# Patient Record
Sex: Male | Born: 1950 | Race: Black or African American | Hispanic: No | Marital: Married | Smoking: Former smoker
Health system: Southern US, Community
[De-identification: ages and names within clinical notes are randomized; demographics above are authoritative.]

## PROBLEM LIST (undated history)

## (undated) DIAGNOSIS — R32 Unspecified urinary incontinence: Secondary | ICD-10-CM

## (undated) DIAGNOSIS — M8580 Other specified disorders of bone density and structure, unspecified site: Secondary | ICD-10-CM

## (undated) DIAGNOSIS — E291 Testicular hypofunction: Secondary | ICD-10-CM

## (undated) DIAGNOSIS — E785 Hyperlipidemia, unspecified: Secondary | ICD-10-CM

## (undated) DIAGNOSIS — G35 Multiple sclerosis: Secondary | ICD-10-CM

## (undated) DIAGNOSIS — N529 Male erectile dysfunction, unspecified: Secondary | ICD-10-CM

## (undated) HISTORY — DX: Multiple sclerosis: G35

## (undated) HISTORY — DX: Testicular hypofunction: E29.1

## (undated) HISTORY — DX: Unspecified urinary incontinence: R32

## (undated) HISTORY — DX: Other specified disorders of bone density and structure, unspecified site: M85.80

## (undated) HISTORY — DX: Hyperlipidemia, unspecified: E78.5

## (undated) HISTORY — DX: Male erectile dysfunction, unspecified: N52.9

## (undated) HISTORY — PX: UMBILICAL HERNIA REPAIR: SHX196

---

## 2002-03-16 ENCOUNTER — Encounter: Admission: RE | Admit: 2002-03-16 | Discharge: 2002-06-14 | Payer: Self-pay

## 2002-04-07 ENCOUNTER — Encounter: Admission: RE | Admit: 2002-04-07 | Discharge: 2002-05-21 | Payer: Self-pay

## 2002-07-30 ENCOUNTER — Encounter: Admission: RE | Admit: 2002-07-30 | Discharge: 2002-09-22 | Payer: Self-pay

## 2002-09-25 ENCOUNTER — Encounter: Admission: RE | Admit: 2002-09-25 | Discharge: 2002-12-24 | Payer: Self-pay

## 2003-02-22 ENCOUNTER — Encounter: Admission: RE | Admit: 2003-02-22 | Discharge: 2003-02-22 | Payer: Self-pay | Admitting: Family Medicine

## 2003-02-22 ENCOUNTER — Encounter: Payer: Self-pay | Admitting: Family Medicine

## 2003-02-23 ENCOUNTER — Encounter: Admission: RE | Admit: 2003-02-23 | Discharge: 2003-02-23 | Payer: Self-pay | Admitting: Family Medicine

## 2003-02-23 ENCOUNTER — Encounter: Payer: Self-pay | Admitting: Family Medicine

## 2003-02-26 ENCOUNTER — Encounter: Admission: RE | Admit: 2003-02-26 | Discharge: 2003-05-27 | Payer: Self-pay

## 2003-06-01 ENCOUNTER — Encounter
Admission: RE | Admit: 2003-06-01 | Discharge: 2003-08-30 | Payer: Self-pay | Admitting: Physical Medicine & Rehabilitation

## 2003-11-01 ENCOUNTER — Encounter
Admission: RE | Admit: 2003-11-01 | Discharge: 2004-01-30 | Payer: Self-pay | Admitting: Physical Medicine & Rehabilitation

## 2004-03-24 ENCOUNTER — Encounter
Admission: RE | Admit: 2004-03-24 | Discharge: 2004-06-22 | Payer: Self-pay | Admitting: Physical Medicine & Rehabilitation

## 2004-06-21 ENCOUNTER — Encounter
Admission: RE | Admit: 2004-06-21 | Discharge: 2004-09-18 | Payer: Self-pay | Admitting: Physical Medicine & Rehabilitation

## 2004-09-18 ENCOUNTER — Encounter
Admission: RE | Admit: 2004-09-18 | Discharge: 2004-12-13 | Payer: Self-pay | Admitting: Physical Medicine & Rehabilitation

## 2004-09-19 ENCOUNTER — Ambulatory Visit: Payer: Self-pay | Admitting: Physical Medicine & Rehabilitation

## 2004-09-28 ENCOUNTER — Ambulatory Visit (HOSPITAL_COMMUNITY): Admission: RE | Admit: 2004-09-28 | Discharge: 2004-09-28 | Payer: Self-pay | Admitting: General Surgery

## 2004-12-13 ENCOUNTER — Encounter
Admission: RE | Admit: 2004-12-13 | Discharge: 2005-03-13 | Payer: Self-pay | Admitting: Physical Medicine & Rehabilitation

## 2005-03-06 ENCOUNTER — Ambulatory Visit: Payer: Self-pay | Admitting: Physical Medicine & Rehabilitation

## 2005-10-03 ENCOUNTER — Ambulatory Visit (HOSPITAL_COMMUNITY): Admission: RE | Admit: 2005-10-03 | Discharge: 2005-10-03 | Payer: Self-pay | Admitting: General Surgery

## 2005-11-30 ENCOUNTER — Encounter: Admission: RE | Admit: 2005-11-30 | Discharge: 2005-11-30 | Payer: Self-pay | Admitting: Family Medicine

## 2005-11-30 IMAGING — CR DG ABDOMEN 2V
3 series · 3 of 3 positions shown · non-contrast
Comparison: none

CLINICAL DATA: Left upper abdominal pain. 
 ABDOMEN ? 2 VIEW:

[view not recorded (1 of 3)]
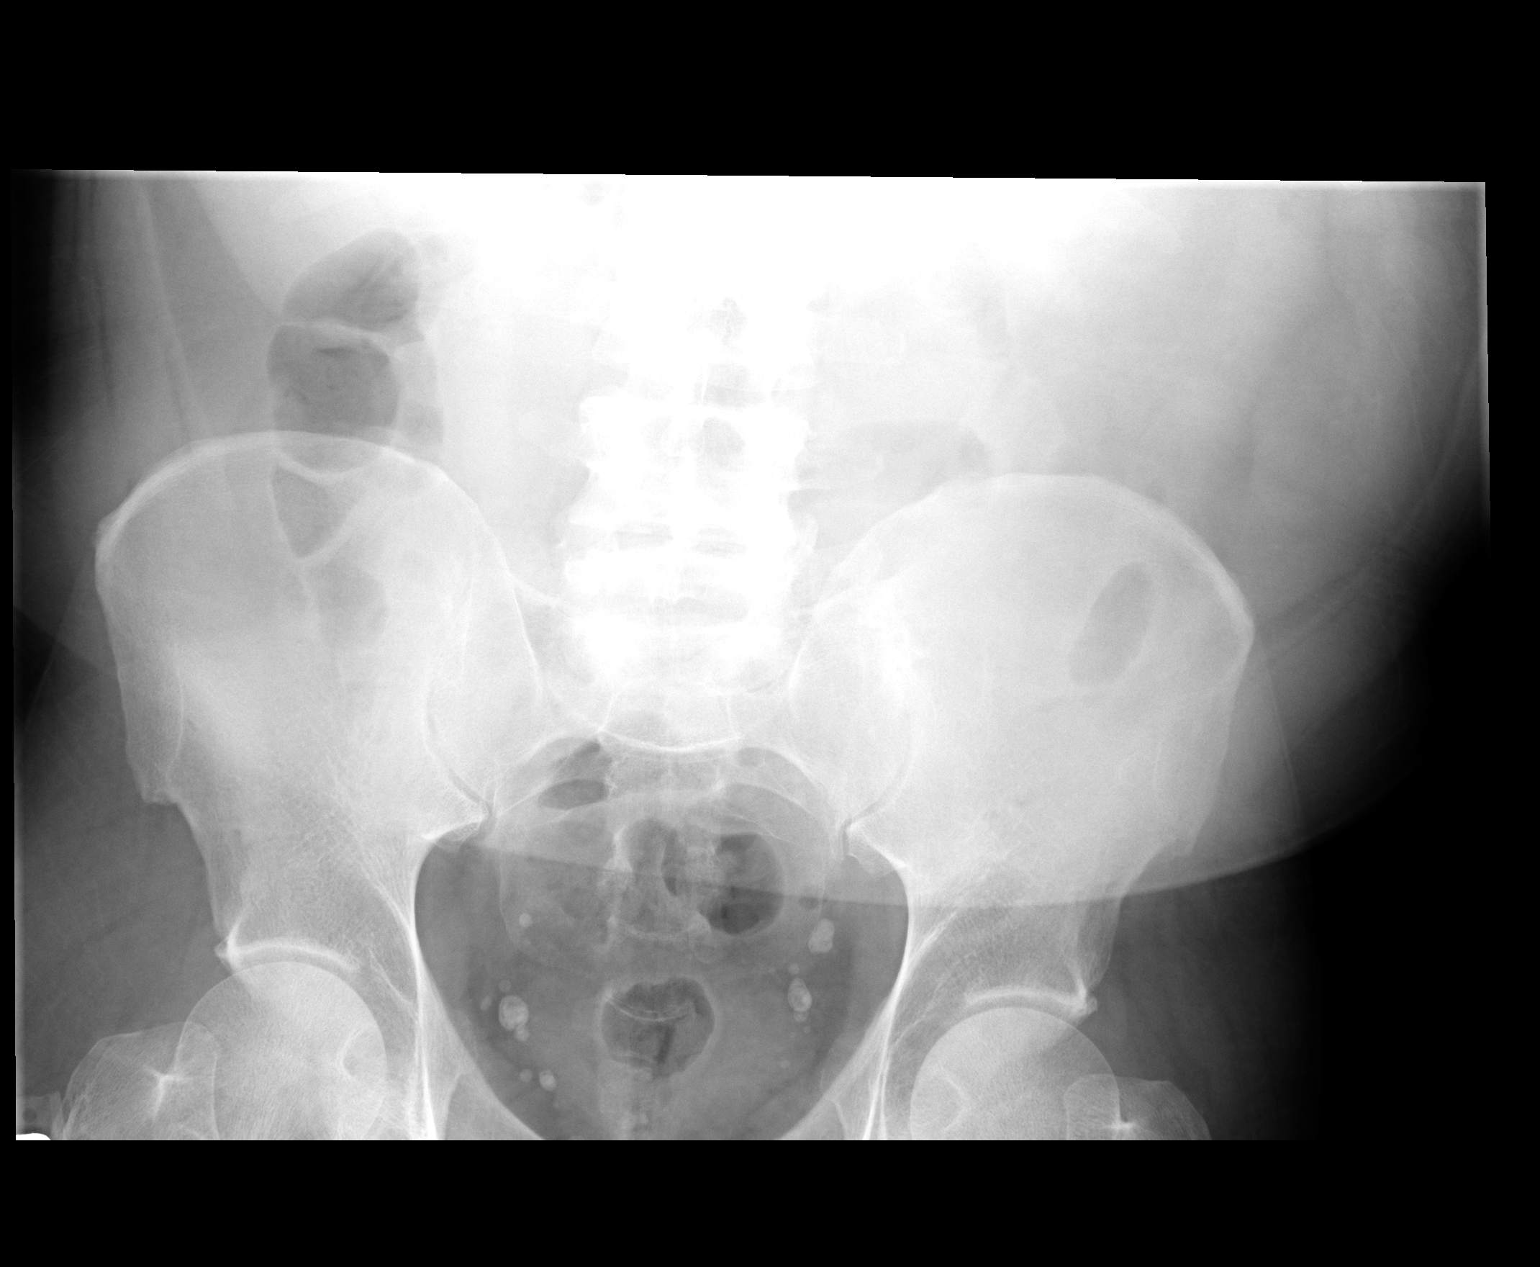

[view not recorded (2 of 3)]
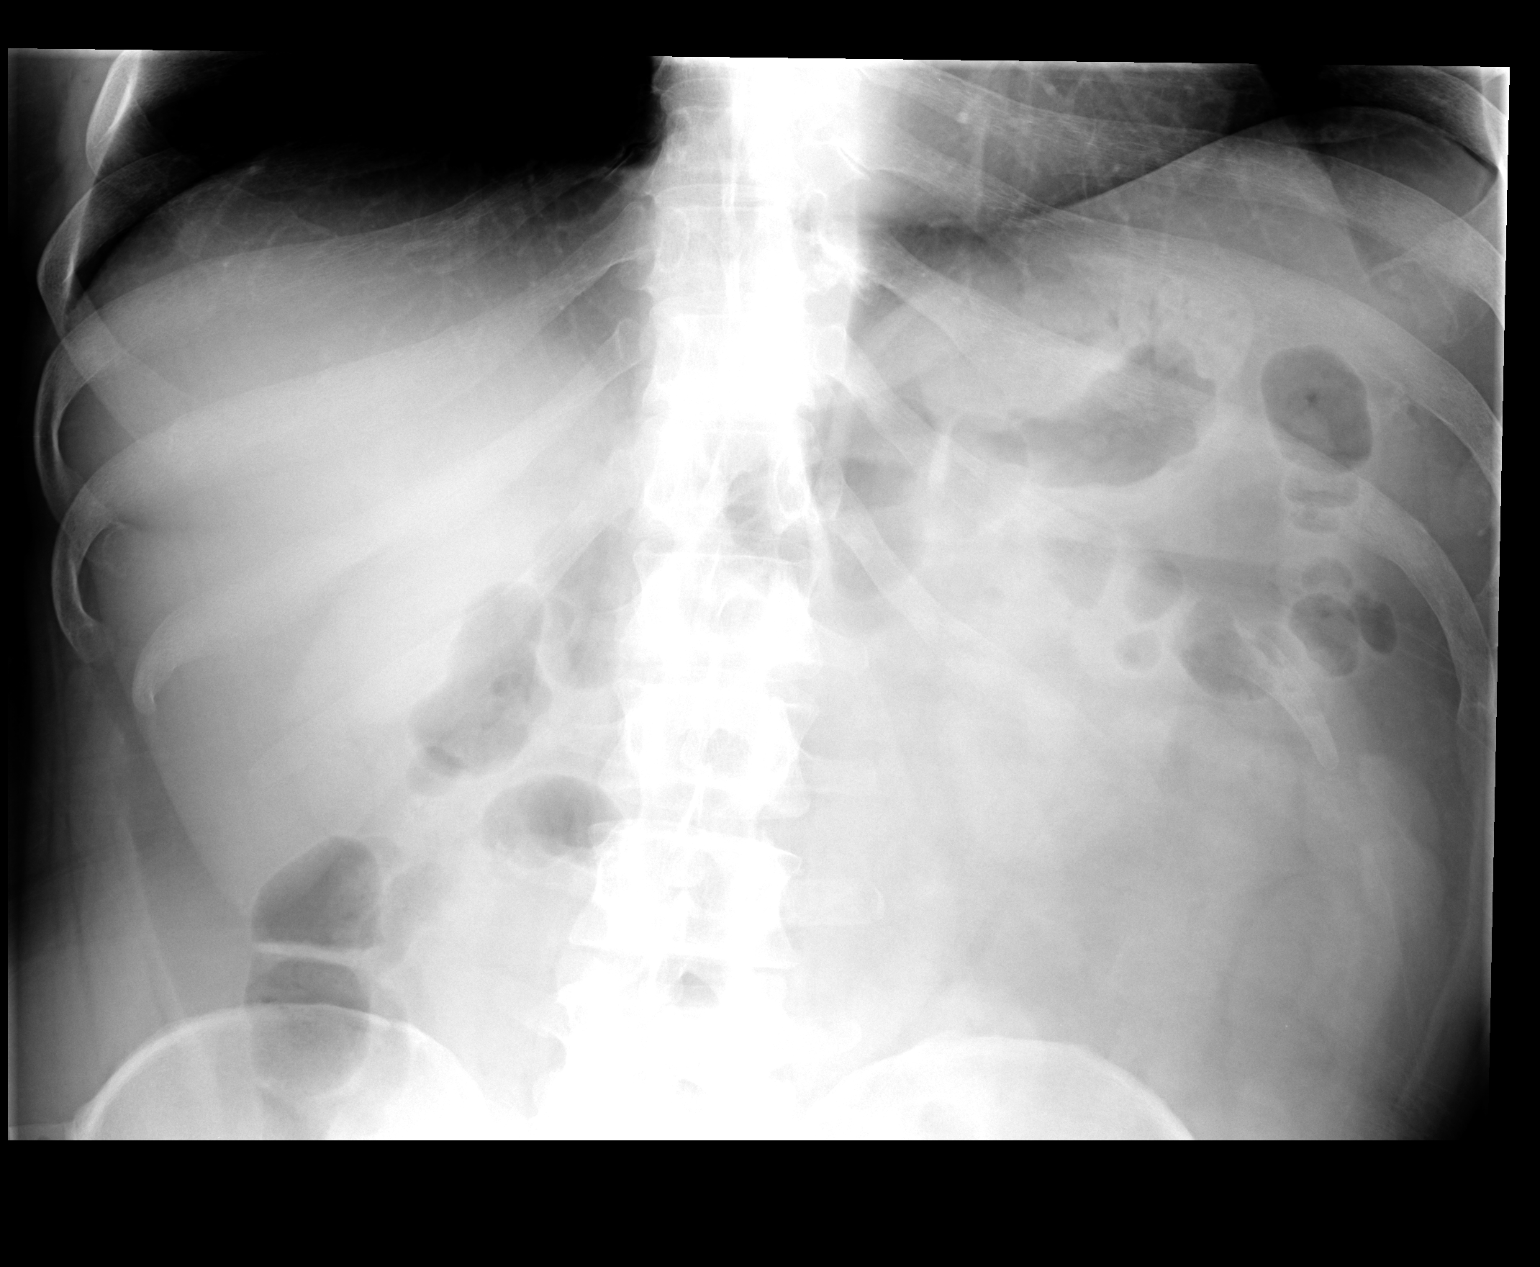

[view not recorded (3 of 3)]
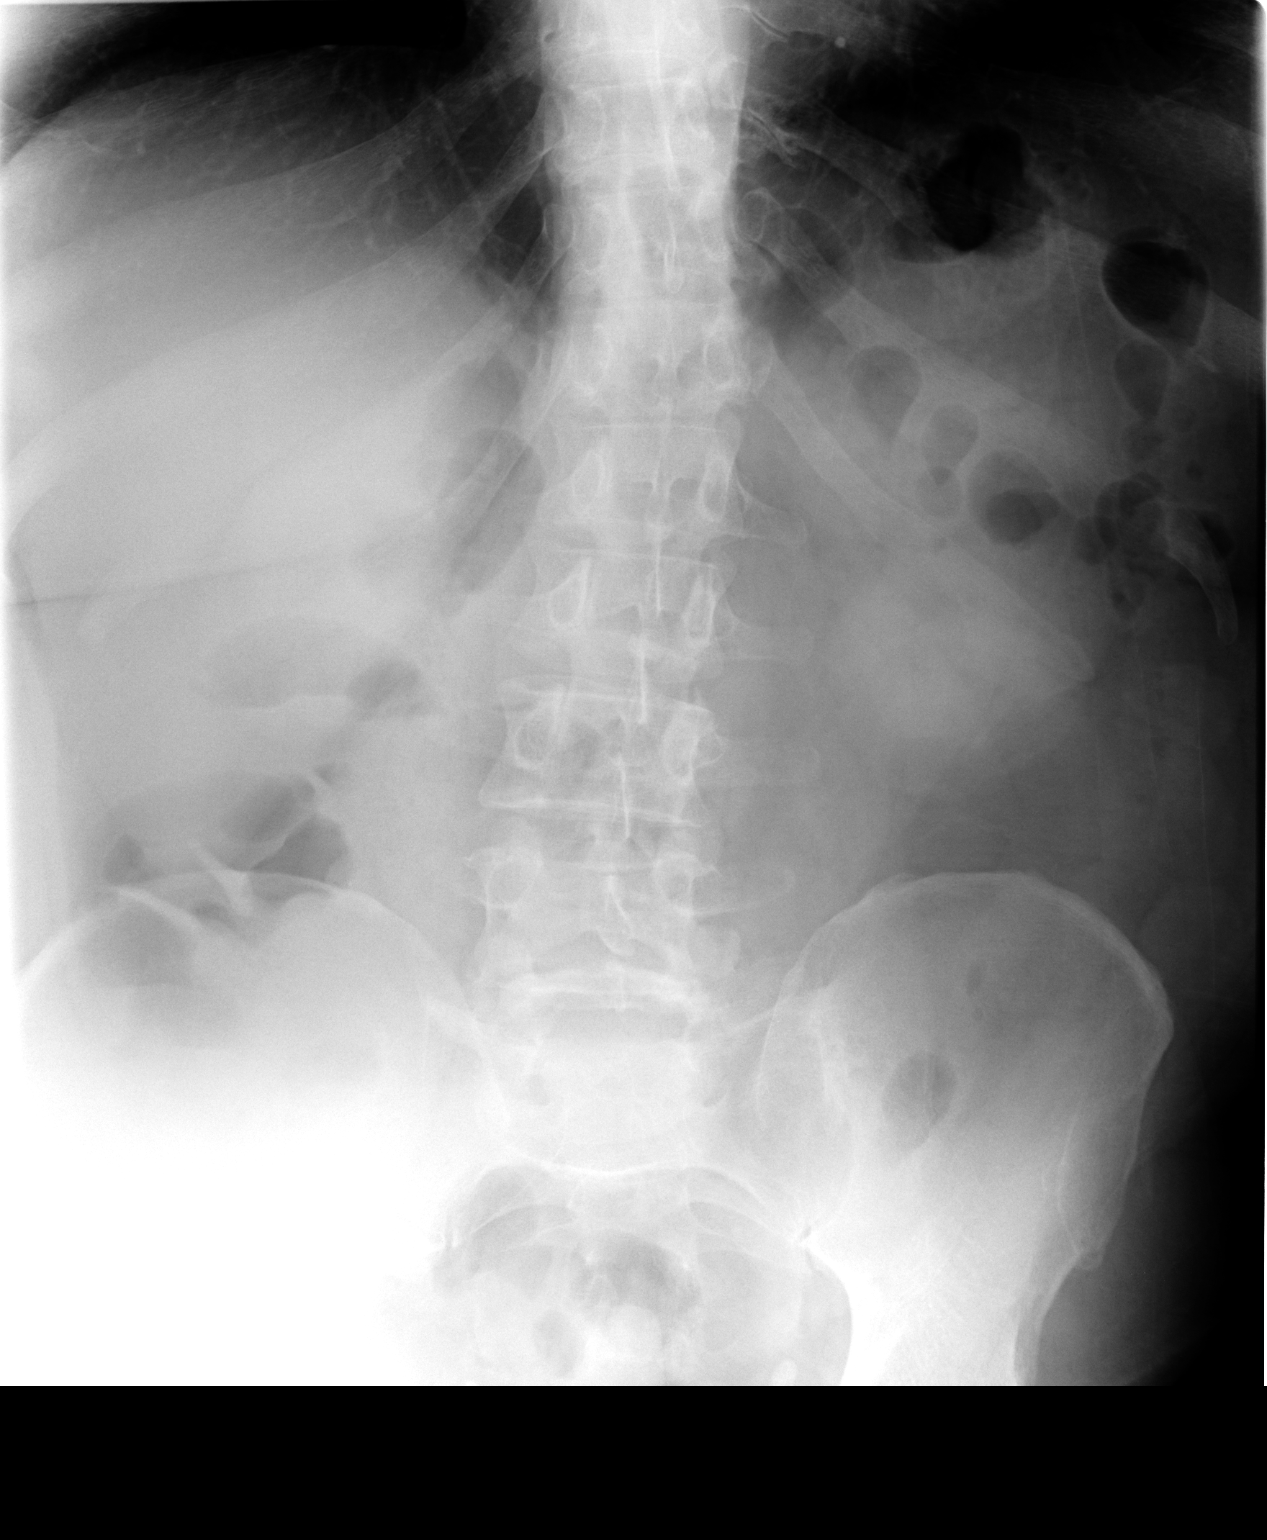

[3 of 3 positions shown; findings below may reference images not displayed]

FINDINGS: Supine and erect views of the abdomen show no evidence of bowel obstruction.  No free air is seen.  No opaque calculi are noted.
IMPRESSION: No obstruction.  No free air.

## 2006-07-22 ENCOUNTER — Ambulatory Visit: Payer: Self-pay | Admitting: Family Medicine

## 2007-02-13 ENCOUNTER — Ambulatory Visit: Payer: Self-pay | Admitting: Family Medicine

## 2007-02-20 ENCOUNTER — Ambulatory Visit: Payer: Self-pay | Admitting: Family Medicine

## 2007-02-20 LAB — CONVERTED CEMR LAB
ALT: 29 units/L (ref 0–40)
AST: 25 units/L (ref 0–37)
Cholesterol: 126 mg/dL (ref 0–200)
HDL: 33.3 mg/dL — ABNORMAL LOW (ref >39.0)
LDL Cholesterol: 80 mg/dL (ref 0–99)
Total CHOL/HDL Ratio: 3.8
Triglycerides: 65 mg/dL (ref 0–149)
VLDL: 13 mg/dL (ref 0–40)

## 2007-08-06 ENCOUNTER — Ambulatory Visit: Payer: Self-pay | Admitting: Family Medicine

## 2007-08-06 DIAGNOSIS — G35 Multiple sclerosis: Secondary | ICD-10-CM

## 2007-08-07 DIAGNOSIS — E119 Type 2 diabetes mellitus without complications: Secondary | ICD-10-CM

## 2007-08-07 DIAGNOSIS — E785 Hyperlipidemia, unspecified: Secondary | ICD-10-CM

## 2007-08-08 LAB — CONVERTED CEMR LAB
ALT: 26 units/L (ref 0–53)
AST: 24 units/L (ref 0–37)
Cholesterol: 151 mg/dL (ref 0–200)
Glucose, Bld: 111 mg/dL — ABNORMAL HIGH (ref 70–99)
HDL: 34.8 mg/dL — ABNORMAL LOW (ref >39.0)
LDL Cholesterol: 92 mg/dL (ref 0–99)
PSA: 0.37 ng/mL (ref 0.10–4.00)
Total CHOL/HDL Ratio: 4.3
Triglycerides: 119 mg/dL (ref 0–149)
VLDL: 24 mg/dL (ref 0–40)

## 2008-02-06 ENCOUNTER — Encounter (INDEPENDENT_AMBULATORY_CARE_PROVIDER_SITE_OTHER): Payer: Self-pay | Admitting: *Deleted

## 2008-02-06 ENCOUNTER — Ambulatory Visit: Payer: Self-pay | Admitting: Family Medicine

## 2008-02-06 LAB — CONVERTED CEMR LAB
ALT: 24 units/L (ref 0–53)
AST: 33 units/L (ref 0–37)
Cholesterol: 132 mg/dL (ref 0–200)
Glucose, Bld: 97 mg/dL (ref 70–99)
HDL: 37.8 mg/dL — ABNORMAL LOW (ref >39.0)
LDL Cholesterol: 79 mg/dL (ref 0–99)
Total CHOL/HDL Ratio: 3.5
Triglycerides: 75 mg/dL (ref 0–149)
VLDL: 15 mg/dL (ref 0–40)

## 2008-02-16 ENCOUNTER — Ambulatory Visit: Payer: Self-pay | Admitting: Gastroenterology

## 2008-03-01 ENCOUNTER — Encounter (INDEPENDENT_AMBULATORY_CARE_PROVIDER_SITE_OTHER): Payer: Self-pay | Admitting: Family Medicine

## 2008-03-01 ENCOUNTER — Ambulatory Visit: Payer: Self-pay | Admitting: Gastroenterology

## 2008-03-05 ENCOUNTER — Ambulatory Visit: Payer: Self-pay | Admitting: Family Medicine

## 2008-03-05 DIAGNOSIS — E291 Testicular hypofunction: Secondary | ICD-10-CM

## 2008-03-05 DIAGNOSIS — F528 Other sexual dysfunction not due to a substance or known physiological condition: Secondary | ICD-10-CM

## 2008-06-01 ENCOUNTER — Ambulatory Visit: Payer: Self-pay | Admitting: Internal Medicine

## 2008-06-01 DIAGNOSIS — G4700 Insomnia, unspecified: Secondary | ICD-10-CM

## 2008-06-03 LAB — CONVERTED CEMR LAB
BUN: 19 mg/dL (ref 6–23)
Basophils Absolute: 0 10*3/uL (ref 0.0–0.1)
Basophils Relative: 0.8 % (ref 0.0–1.0)
CO2: 26 meq/L (ref 19–32)
Calcium: 9.1 mg/dL (ref 8.4–10.5)
Chloride: 102 meq/L (ref 96–112)
Creatinine, Ser: 1 mg/dL (ref 0.4–1.5)
Eosinophils Absolute: 0.2 10*3/uL (ref 0.0–0.7)
Eosinophils Relative: 4.5 % (ref 0.0–5.0)
GFR calc Af Amer: 99 mL/min
GFR calc non Af Amer: 82 mL/min
Glucose, Bld: 103 mg/dL — ABNORMAL HIGH (ref 70–99)
HCT: 41.4 % (ref 39.0–52.0)
Hemoglobin: 13.6 g/dL (ref 13.0–17.0)
Lymphocytes Relative: 33.4 % (ref 12.0–46.0)
MCHC: 32.8 g/dL (ref 30.0–36.0)
MCV: 76.9 fL — ABNORMAL LOW (ref 78.0–100.0)
Monocytes Absolute: 0.5 10*3/uL (ref 0.1–1.0)
Monocytes Relative: 9.9 % (ref 3.0–12.0)
Neutro Abs: 2.4 10*3/uL (ref 1.4–7.7)
Neutrophils Relative %: 51.4 % (ref 43.0–77.0)
Platelets: 251 10*3/uL (ref 150–400)
Potassium: 3.7 meq/L (ref 3.5–5.1)
RBC: 5.38 M/uL (ref 4.22–5.81)
RDW: 14 % (ref 11.5–14.6)
Sodium: 137 meq/L (ref 135–145)
TSH: 0.72 microintl units/mL (ref 0.35–5.50)
WBC: 4.6 10*3/uL (ref 4.5–10.5)

## 2008-09-10 ENCOUNTER — Ambulatory Visit: Payer: Self-pay | Admitting: Internal Medicine

## 2008-09-11 ENCOUNTER — Encounter: Payer: Self-pay | Admitting: Internal Medicine

## 2008-09-15 ENCOUNTER — Telehealth: Payer: Self-pay | Admitting: Internal Medicine

## 2008-09-28 ENCOUNTER — Encounter (INDEPENDENT_AMBULATORY_CARE_PROVIDER_SITE_OTHER): Payer: Self-pay | Admitting: *Deleted

## 2008-10-01 ENCOUNTER — Encounter (INDEPENDENT_AMBULATORY_CARE_PROVIDER_SITE_OTHER): Payer: Self-pay | Admitting: *Deleted

## 2008-10-12 ENCOUNTER — Ambulatory Visit: Payer: Self-pay | Admitting: Internal Medicine

## 2009-03-31 ENCOUNTER — Ambulatory Visit: Payer: Self-pay | Admitting: Internal Medicine

## 2009-03-31 ENCOUNTER — Telehealth (INDEPENDENT_AMBULATORY_CARE_PROVIDER_SITE_OTHER): Payer: Self-pay | Admitting: *Deleted

## 2009-04-04 ENCOUNTER — Telehealth (INDEPENDENT_AMBULATORY_CARE_PROVIDER_SITE_OTHER): Payer: Self-pay | Admitting: *Deleted

## 2009-04-04 LAB — CONVERTED CEMR LAB
ALT: 20 units/L (ref 0–53)
AST: 24 units/L (ref 0–37)
BUN: 17 mg/dL (ref 6–23)
CO2: 30 meq/L (ref 19–32)
Calcium: 9 mg/dL (ref 8.4–10.5)
Chloride: 105 meq/L (ref 96–112)
Cholesterol: 113 mg/dL (ref 0–200)
Creatinine, Ser: 0.9 mg/dL (ref 0.4–1.5)
GFR calc non Af Amer: 110.8 mL/min (ref >60)
Glucose, Bld: 100 mg/dL — ABNORMAL HIGH (ref 70–99)
HDL: 37.4 mg/dL — ABNORMAL LOW (ref >39.00)
Hgb A1c MFr Bld: 6.3 % (ref 4.6–6.5)
LDL Cholesterol: 55 mg/dL (ref 0–99)
PSA: 1.13 ng/mL (ref 0.10–4.00)
Potassium: 4.1 meq/L (ref 3.5–5.1)
Sodium: 142 meq/L (ref 135–145)
Total CHOL/HDL Ratio: 3
Triglycerides: 103 mg/dL (ref 0.0–149.0)
VLDL: 20.6 mg/dL (ref 0.0–40.0)

## 2009-04-26 ENCOUNTER — Encounter: Payer: Self-pay | Admitting: Internal Medicine

## 2009-04-26 ENCOUNTER — Encounter: Admission: RE | Admit: 2009-04-26 | Discharge: 2009-04-26 | Payer: Self-pay | Admitting: Internal Medicine

## 2009-09-12 ENCOUNTER — Ambulatory Visit: Payer: Self-pay | Admitting: Internal Medicine

## 2009-09-15 ENCOUNTER — Encounter (INDEPENDENT_AMBULATORY_CARE_PROVIDER_SITE_OTHER): Payer: Self-pay | Admitting: *Deleted

## 2009-09-19 ENCOUNTER — Encounter: Payer: Self-pay | Admitting: Internal Medicine

## 2009-09-19 LAB — CONVERTED CEMR LAB
ALT: 18 units/L
Calcium: 9.5 mg/dL
Creatinine, Ser: 0.88 mg/dL
Total Bilirubin: 0.5 mg/dL
WBC, blood: 6.5 10*3/uL

## 2009-09-22 ENCOUNTER — Ambulatory Visit: Payer: Self-pay | Admitting: Internal Medicine

## 2009-09-22 DIAGNOSIS — R935 Abnormal findings on diagnostic imaging of other abdominal regions, including retroperitoneum: Secondary | ICD-10-CM

## 2009-09-22 DIAGNOSIS — K5900 Constipation, unspecified: Secondary | ICD-10-CM | POA: Insufficient documentation

## 2009-10-05 ENCOUNTER — Ambulatory Visit: Payer: Self-pay | Admitting: Internal Medicine

## 2009-10-18 ENCOUNTER — Encounter: Payer: Self-pay | Admitting: Internal Medicine

## 2010-01-12 ENCOUNTER — Ambulatory Visit: Payer: Self-pay | Admitting: Internal Medicine

## 2010-01-13 LAB — CONVERTED CEMR LAB
Basophils Absolute: 0 10*3/uL (ref 0.0–0.1)
Eosinophils Absolute: 0.1 10*3/uL (ref 0.0–0.7)
HCT: 41 % (ref 39.0–52.0)
Hemoglobin: 13 g/dL (ref 13.0–17.0)
Lymphs Abs: 1.7 10*3/uL (ref 0.7–4.0)
MCHC: 31.7 g/dL (ref 30.0–36.0)
MCV: 79.9 fL (ref 78.0–100.0)
Neutro Abs: 3.5 10*3/uL (ref 1.4–7.7)
RDW: 15.1 % — ABNORMAL HIGH (ref 11.5–14.6)

## 2010-05-11 ENCOUNTER — Ambulatory Visit: Payer: Self-pay | Admitting: Internal Medicine

## 2010-05-14 LAB — CONVERTED CEMR LAB
Hgb A1c MFr Bld: 6.2 % (ref 4.6–6.5)
VLDL: 16 mg/dL (ref 0.0–40.0)

## 2010-05-20 ENCOUNTER — Encounter: Payer: Self-pay | Admitting: Internal Medicine

## 2010-05-23 ENCOUNTER — Telehealth (INDEPENDENT_AMBULATORY_CARE_PROVIDER_SITE_OTHER): Payer: Self-pay | Admitting: *Deleted

## 2010-05-25 ENCOUNTER — Ambulatory Visit: Payer: Self-pay | Admitting: Internal Medicine

## 2010-05-25 DIAGNOSIS — L89609 Pressure ulcer of unspecified heel, unspecified stage: Secondary | ICD-10-CM | POA: Insufficient documentation

## 2010-09-12 ENCOUNTER — Ambulatory Visit: Payer: Self-pay | Admitting: Internal Medicine

## 2010-09-12 DIAGNOSIS — B369 Superficial mycosis, unspecified: Secondary | ICD-10-CM | POA: Insufficient documentation

## 2010-09-18 LAB — CONVERTED CEMR LAB
ALT: 21 units/L (ref 0–53)
AST: 22 units/L (ref 0–37)
Chloride: 105 meq/L (ref 96–112)
Creatinine, Ser: 0.9 mg/dL (ref 0.4–1.5)
GFR calc non Af Amer: 117.78 mL/min (ref >60)
PSA: 0.7 ng/mL (ref 0.10–4.00)
Potassium: 4.5 meq/L (ref 3.5–5.1)

## 2011-01-23 NOTE — Assessment & Plan Note (Signed)
Summary: yearly check/cbs   Vital Signs:  Patient profile:   60 year old male Weight:      188.50 pounds Pulse rate:   92 / minute Pulse rhythm:   regular BP sitting:   138 / 84  (left arm) Cuff size:   regular  Vitals Entered By: Army Fossa CMA (September 12, 2010 10:05 AM) CC: Yearly check, fasting Comments Refill on Viagra Pharm- [facility] Flu [facility]   History of Present Illness: Here for Medicare AWV:  1.   Risk factors based on Past M, S, F history: yes , risk factors reviewed 2.   Physical Activities: h/o MS, trying to remain active, does swimming (at the [facility]"). no yard work  3.   Depression/mood: denies problems w/ depression, no problems noted today  4.   Hearing: denies problems, no problems noted  5.   ADL's: takes his own showers, able to dress himself,still drives  6.   Fall Risk: high due to MS, counseled  7.   Home Safety: feels safe at home  8.   Height, weight, &visual acuity: see VS, uses glasses, vision well corrected  9.   Counseling: yes, see below  10.   Labs ordered based on risk factors: yes  11.           Referral Coordination: if needed  12.           Care Plan-- see a/p  13.            Cognitive Assessment : memory and cognition seemed within normal  In addition, we discussed the following issues was seen recently with a pressure ulcer---- resolved AODM-- no ambulatory CBGs , on diet only, trying to eat healthy  DYSLIPIDEMIA-- good medication compliance  MULTIPLE SCLEROSIS  -- [facility], last visit several months ago, no problems ; no recent exhacerbations   Current Medications (verified): 1)  Lipitor 10 Mg  Tabs (Atorvastatin Calcium) .... Take One Tablet Daily 2)  Neurontin 800 Mg  Tabs (Gabapentin) .... Take As Directed [facility] 3)  Viagra 100 Mg Tabs (Sildenafil Citrate) .Marland Kitchen.. 1 By Mouth Once Daily Prn 4)  Naproxen 500 Mg Tabs (Naproxen) .... Per Neurology 5)  Lactulose  Soln (Lactulose) .... Once Daily - As  Needed 6)  Chewable Vit C 7)  Provigil 8)  Aspirin 81 Mg Tbec (Aspirin) .... Once Daily  Allergies (verified): No Known Drug Allergies  Past History:  Past Medical History: Reviewed history from 01/12/2010 and no changes required. AODM ERECTILE DYSFUNCTION  HYPOGONADISM --f/u at [facility] DYSLIPIDEMIA MULTIPLE SCLEROSIS Dx in the 80s (final Dx in the 90s) neurology Dr is @ [place] (goes twice a year) Cscope 02-2008: tics   Past Surgical History: Reviewed history from 03/31/2009 and no changes required. umbilical hernia repair   Family History: Reviewed history from 06/01/2008 and no changes required. CAD - M DM - M, F (late onset) stroke - no HTN - no colon Ca - no prostate Ca - no  Social History: Married wife w/ breast cancer , sleep apnea ; she is stable  1 child, 3 G-kids  Never Smoked Alcohol use-no Drug use-no Regular exercise-yes Retired Education officer, community  Review of Systems CV:  Denies chest pain or discomfort and swelling of feet. Resp:  Denies cough and shortness of breath. GI:  Denies bloody stools, diarrhea, nausea, and vomiting. GU:  Denies dysuria and hematuria.  Physical Exam  General:  alert, well-developed, and well-nourished.  Neck:  no masses, no thyromegaly, and normal carotid upstroke.   Lungs:  normal respiratory effort, no intercostal retractions, no accessory muscle use, and normal breath sounds.   Heart:  normal rate, regular rhythm, and no murmur.   Abdomen:  soft, non-tender, no distention, no masses, no guarding, and no rigidity.   Rectal:  small external hemorrhoids noted. Normal sphincter tone. No rectal masses or tenderness. Prostate:  Prostate gland firm and smooth, no enlargement, nodularity, tenderness, mass, asymmetry or induration. Pulses:  good pedal pulses bilaterally Extremities:  no edema  Diabetes Management Exam:    Foot Exam (with socks and/or shoes not present):       Sensory-Pinprick/Light touch:          Left medial foot (L-4):  normal          Left dorsal foot (L-5): normal          Left lateral foot (S-1): normal          Right medial foot (L-4): normal          Right dorsal foot (L-5): normal          Right lateral foot (S-1): normal       Sensory-Monofilament:          Left foot: diminished          Right foot: diminished       Inspection:          Left foot: abnormal             Comments: some maceration between the toes          Right foot: normal       Nails:          Left foot: normal          Right foot: normal   Impression & Recommendations:  Problem # 1:  WELL ADULT (ICD-V70.0)  chart reviewed  Td 2008 pneumonia shot 03-2009 flu shot today  shingles shot-- reports he had that already  Cscope 09  PSA  counseled about diet, exercise, fall prevention  Orders: Medicare -1st Annual Wellness Visit [phone])  Problem # 2:  PRESSURE ULCER HEEL (ICD-707.07) resolved   Problem # 3:  DERMATOMYCOSIS (ICD-111.9) suspect fungal dermatitis between his toes, prescribe ketoconazole  His updated medication list for this problem includes:    Ketoconazole 2 % Crea (Ketoconazole) .Marland Kitchen... Apply twice a day between the toes for 10 days  Problem # 4:  DM (ICD-250.00) due for labs, on diet only His updated medication list for this problem includes:    Aspirin 81 Mg Tbec (Aspirin) ..... Once daily  Labs Reviewed: Creat: 0.88 (09/19/2009)    Reviewed HgBA1c results: 6.2 (05/11/2010)  6.2 (01/12/2010)  Orders: TLB-BMP (Basic Metabolic Panel-BMET) (80048-METABOL) TLB-A1C / Hgb A1C (Glycohemoglobin) (83036-A1C) Specimen Handling (27253)  Problem # 5:  DYSLIPIDEMIA (ICD-272.4) well controlled, labs His updated medication list for this problem includes:    Lipitor 10 Mg Tabs (Atorvastatin calcium) .Marland Kitchen... Take one tablet daily    Labs Reviewed: SGOT: 20 (09/19/2009)   SGPT: 18 (09/19/2009)   HDL:39.00 (05/11/2010), 37.40 (03/31/2009)  LDL:66 (05/11/2010), 55 (03/31/2009)  Chol:121 (05/11/2010), 113  (03/31/2009)  Trig:80.0 (05/11/2010), 103.0 (03/31/2009)  Orders: Venipuncture (66440) TLB-ALT (SGPT) (84460-ALT) TLB-AST (SGOT) (84450-SGOT) TLB-A1C / Hgb A1C (Glycohemoglobin) (83036-A1C) TLB-TSH (Thyroid Stimulating Hormone) (84443-TSH) Specimen Handling (34742)  Problem # 6:  ERECTILE DYSFUNCTION (ICD-302.72) request a RF, done  His updated medication list for this problem includes:  Viagra 100 Mg Tabs (Sildenafil citrate) .Marland Kitchen... 1 by mouth once daily prn  Complete Medication List: 1)  Lipitor 10 Mg Tabs (Atorvastatin calcium) .... Take one tablet daily 2)  Neurontin 800 Mg Tabs (Gabapentin) .... [facility] 3)  Viagra 100 Mg Tabs (Sildenafil citrate) .Marland Kitchen.. 1 by mouth once daily prn 4)  Naproxen 500 Mg Tabs (Naproxen) .... Per neurology 5)  Lactulose Soln (Lactulose) .... Once daily - as needed 6)  Chewable Vit C  7)  Provigil  8)  Aspirin 81 Mg Tbec (Aspirin) .... Once daily 9)  Ketoconazole 2 % Crea (Ketoconazole) .... Apply twice a day between the toes for 10 days  Other Orders: Flu Vaccine 56yrs + MEDICARE PATIENTS (Z6109) Administration Flu vaccine - MCR (G0008) TLB-PSA (Prostate Specific Antigen) (84153-PSA) Flu Vaccine Consent Questions     Do you have a history of severe allergic reactions to this vaccine? no    Any prior history of allergic reactions to egg and/or gelatin? no    Do you have a sensitivity to the preservative Thimersol? no    Do you have a past history of Guillan-Barre Syndrome? no    Do you currently have an acute febrile illness? no    Have you ever had a severe reaction to latex? no    Vaccine information given and explained to patient? yes    Are you currently pregnant? no    Lot Number:[id number]   Exp Date:06/23/2011   Site Given  right Deltoid IMion Flu vaccine - MCR (U0454)  Patient Instructions: 1)  Please schedule a follow-up appointment in 6 months .  Prescriptions: VIAGRA 100 MG TABS (SILDENAFIL CITRATE) 1 by  mouth once daily prn  #15 x 3   Entered and Authorized by:   Elita Quick E. Paz MD   Signed by:   Nolon Rod. Paz MD on 09/12/2010   Method used:   Print then Give to Patient   RxID:   [id number] KETOCONAZOLE 2 % CREA (KETOCONAZOLE) apply twice a day between the toes for 10 days  #1 x 1   Entered and Authorized by:   Nolon Rod. Paz MD   Signed by:   Nolon Rod. Paz MD on 09/12/2010   Method used:   Print then Give to Patient   RxID:   [id number]     .lbmedflu

## 2011-01-23 NOTE — Assessment & Plan Note (Signed)
Summary: FOLLOW UP/RH.........Marland Kitchen   Vital Signs:  Patient profile:   60 year old male Height:      67.75 inches Weight:      194.2 pounds BMI:     29.85 Pulse rate:   76 / minute BP sitting:   120 / 80  Vitals Entered By: Shary Decamp (May 11, 2010 11:18 AM) CC: rov, fasting, per wife "I fix his pill box but he picks & chooses what medications he wants to take", pt is NON COMPLIANT with lipitor, takes "maybe" 2-3 times a week   History of Present Illness: ROV, fasting  per wife "I fix his pill box but he picks & chooses what medications he wants to take" patient, is NON COMPLIANT with lipitor, takes "maybe" 2-3 times a week  MS-- saw neuro, they started provigil, helping?  wife states he doesn't take it routinely    Current Medications (verified): 1)  Lipitor 10 Mg  Tabs (Atorvastatin Calcium) .... Take One Tablet Daily 2)  Neurontin 800 Mg  Tabs (Gabapentin) .... [facility] 3)  Viagra 100 Mg Tabs (Sildenafil Citrate) .Marland Kitchen.. 1 By Mouth Once Daily Prn 4)  Naproxen 500 Mg Tabs (Naproxen) .... Per Neurology 5)  Lactulose  Soln (Lactulose) .... Once Daily - As Needed 6)  Chewable Vit C 7)  Provigil  Allergies (verified): No Known Drug Allergies  Past History:  Past Medical History: Reviewed history from 01/12/2010 and no changes required. AODM ERECTILE DYSFUNCTION  HYPOGONADISM --f/u at [facility] DYSLIPIDEMIA MULTIPLE SCLEROSIS Dx in the 80s (final Dx in the 90s) neurology Dr is @ [place] (goes twice a year) Cscope 02-2008: tics   Past Surgical History: Reviewed history from 03/31/2009 and no changes required. umbilical hernia repair   Social History: Reviewed history from 03/31/2009 and no changes required. Married wife w/ breast cancer  1 child, 3 G-kids  Never Smoked Alcohol use-no Drug use-no Regular exercise-yes Retired Education officer, community  Review of Systems CV:  Denies chest pain or discomfort and swelling of feet. Resp:  Denies cough and shortness of  breath. Endo:  no ambulatory CBGs  diet-- no major changes on habits since last OV.  Physical Exam  General:  alert and well-developed.   Lungs:  normal respiratory effort, no intercostal retractions, no accessory muscle use, and normal breath sounds.   Heart:  normal rate, regular rhythm, and no murmur.   Extremities:  no pretibial edema bilaterally    Impression & Recommendations:  Problem # 1:  DM (ICD-250.00) encouraged diet, labs  Orders: TLB-A1C / Hgb A1C (Glycohemoglobin) (83036-A1C)  Labs Reviewed: Creat: 0.88 (09/19/2009)    Reviewed HgBA1c results: 6.2 (01/12/2010)  6.3 (09/12/2009)  Problem # 2:  DYSLIPIDEMIA (ICD-272.4) encouraged compliance and stay active; he does swimm explained benefits of well controlled cholesterol  His updated medication list for this problem includes:    Lipitor 10 Mg Tabs (Atorvastatin calcium) .Marland Kitchen... Take one tablet daily  Orders: Venipuncture (29518) TLB-Lipid Panel (80061-LIPID)  Labs Reviewed: SGOT: 20 (09/19/2009)   SGPT: 18 (09/19/2009)   HDL:37.40 (03/31/2009), 37.8 (02/06/2008)  LDL:55 (03/31/2009), 79 (02/06/2008)  Chol:113 (03/31/2009), 132 (02/06/2008)  Trig:103.0 (03/31/2009), 75 (02/06/2008)  Problem # 3:  MULTIPLE SCLEROSIS (ICD-340) [facility] Provigil   Complete Medication List: 1)  Lipitor 10 Mg Tabs (Atorvastatin calcium) .... Take one tablet daily 2)  Neurontin 800 Mg Tabs (Gabapentin) .... [facility] 3)  Viagra 100 Mg Tabs (  Sildenafil citrate) .Marland Kitchen.. 1 by mouth once daily prn 4)  Naproxen 500 Mg Tabs (Naproxen) .... Per neurology 5)  Lactulose Soln (Lactulose) .... Once daily - as needed 6)  Chewable Vit C  7)  Provigil   Patient Instructions: 1)  Please schedule a follow-up appointment in 4 months  (yearly check up)

## 2011-01-23 NOTE — Letter (Signed)
Summary: [facility]  [facility]   Imported By: Lanelle Bal 06/01/2010 12:25:04  _____________________________________________________________________  External Attachment:    Type:   Image     Comment:   External Document

## 2011-01-23 NOTE — Progress Notes (Signed)
Summary: Wound Care Referral  Phone Note Call from Patient Call back at Home Phone [phone]   Caller: Patient's Wife Reason for Call: Referral Summary of Call: Patient needs a referral to a wound care center. Patient went to [facility] in [facility] this weekend. Records being faxed over.  Initial call taken by: Harold Barban,  May 23, 2010 10:23 AM  Follow-up for Phone Call        was seen over weekend for wound on heel.  was rx'd abx ointment.  was not given oral abx.  wife & pt state that the wound is looking better today.  scheduled f/u appt on wound for 6/2.Marland KitchenMarland KitchenMarland KitchenShary Decamp  May 23, 2010 2:46 PM

## 2011-01-23 NOTE — Assessment & Plan Note (Signed)
Summary: 4 MTH FU/NS/KDC   Vital Signs:  Patient profile:   60 year old male Height:      67.75 inches Weight:      195 pounds BMI:     29.98 Pulse rate:   60 / minute BP sitting:   110 / 78  Vitals Entered By: Dena Billet CC: fu  Comments pt has cold symptoms that are resoliving Reviewed med list with pt as far as he knows they are correct, but wife takes care of meds.   History of Present Illness: URI symptoms x 2 weeks, almost back to normal DM-- on diet only but not eating as healthy as he should . no ambulatory CBGs   Allergies: No Known Drug Allergies  Past History:  Past Medical History: AODM ERECTILE DYSFUNCTION  HYPOGONADISM --f/u at [facility] DYSLIPIDEMIA MULTIPLE SCLEROSIS Dx in the 80s (final Dx in the 90s) neurology Dr is @ [place] (goes twice a year) Cscope 02-2008: tics   Past Surgical History: Reviewed history from 03/31/2009 and no changes required. umbilical hernia repair   Social History: Reviewed history from 03/31/2009 and no changes required. Married wife w/ breast cancer  1 child, 3 G-kids  Never Smoked Alcohol use-no Drug use-no Regular exercise-yes Retired Education officer, community  Review of Systems       good medication compliance w/ lipitor  was seen w/a pressure ulcdr--completely healed  had a flu shot went to the [place] last week, unsure if he had blood work done   CV:  Denies chest pain or discomfort and swelling of feet. Resp:  Denies cough and shortness of breath.  Physical Exam  General:  alert and well-developed.   Lungs:  normal respiratory effort, no intercostal retractions, no accessory muscle use, and normal breath sounds.   Heart:  normal rate, regular rhythm, and no murmur.   Extremities:  no pretibial edema bilaterally    Impression & Recommendations:  Problem # 1:  PRESSURE ULCER BUTTOCK (ICD-707.05) resolved   Problem # 2:  DM (ICD-250.00)  encouraged to do better w/ diet states he has enough information about healthy diet, just  "has to do it"  Labs Reviewed: Creat: 0.88 (09/19/2009)    Reviewed HgBA1c results: 6.3 (09/12/2009)  6.3 (03/31/2009)  Orders: Venipuncture (37169) TLB-A1C / Hgb A1C (Glycohemoglobin) (83036-A1C) TLB-Microalbumin/Creat Ratio, Urine (82043-MALB) TLB-CBC Platelet - w/Differential (85025-CBCD)  Complete Medication List: 1)  Lipitor 10 Mg Tabs (Atorvastatin calcium) .... Take one tablet daily 2)  Neurontin 800 Mg Tabs (Gabapentin) .... [facility] 3)  Viagra 100 Mg Tabs (Sildenafil citrate) .Marland Kitchen.. 1 by mouth once daily prn 4)  Naproxen 500 Mg Tabs (Naproxen) .... Per neurology 5)  Lactulose Soln (Lactulose) .... Once daily  Patient Instructions: 1)  Please schedule a follow-up appointment in 4 months .

## 2011-01-23 NOTE — Assessment & Plan Note (Signed)
Summary: f/u on wound/swh   Vital Signs:  Patient profile:   60 year old male Height:      67.75 inches Weight:      191 pounds Temp:     98.5 degrees F oral Pulse rate:   66 / minute BP sitting:   120 / 79  (left arm)  Vitals Entered By: Jeremy Johann CMA (May 25, 2010 11:23 AM) CC: F/U WOUND ON LEFT HEAL Comments REVIEWED MED LIST, PATIENT AGREED DOSE AND INSTRUCTION CORRECT    History of Present Illness: noted a wound at the R heel 6 days ago went to a UC, Rx a abx ointment and rec. to keep it wrapped w/ soft gause  area is healing well   Allergies (verified): No Known Drug Allergies  Past History:  Past Medical History: Reviewed history from 01/12/2010 and no changes required. AODM ERECTILE DYSFUNCTION  HYPOGONADISM --f/u at [facility] DYSLIPIDEMIA MULTIPLE SCLEROSIS Dx in the 80s (final Dx in the 90s) neurology Dr is @ [place] (goes twice a year) Cscope 02-2008: tics   Social History: Reviewed history from 03/31/2009 and no changes required. Married wife w/ breast cancer  1 child, 3 G-kids  Never Smoked Alcohol use-no Drug use-no Regular exercise-yes Retired Education officer, community  Review of Systems       denies fevers No purulent discharge, some yellow serun noted in the bandage and that covers the right heel. no lower extremity edema No previous wounds in the right heel  Physical Exam  General:  alert and well-developed.   Extremities:  right heel has a superficial ulcer around 4x5 cm, compared to a few days ago is much improved according to the family; the ulcer is healing up, not oozing, no   odor, no purulent discharge, no fluctuancy   Impression & Recommendations:  Problem # 1:  PRESSURE ULCER HEEL (ICD-707.07) pressure ulcer in the heel, noted 6 days ago, improving.  Unknown how quickly it developed since the patient does not check the area frequently. We had a long discussion about this issue, strategies to prevent future pressure ulcers discussed. Current  pressure ulcer improving, continue with soft  wraps , antibiotic ointments. Patient will let me know if the area gets worse for possible referral to the wound care center  Complete Medication List: 1)  Lipitor 10 Mg Tabs (Atorvastatin calcium) .... Take one tablet daily 2)  Neurontin 800 Mg Tabs (Gabapentin) .... [facility] 3)  Viagra 100 Mg Tabs (Sildenafil citrate) .Marland Kitchen.. 1 by mouth once daily prn 4)  Naproxen 500 Mg Tabs (Naproxen) .... Per neurology 5)  Lactulose Soln (Lactulose) .... Once daily - as needed 6)  Chewable Vit C  7)  Provigil  8)  Aspirin 81 Mg Tbec (Aspirin) .... Once daily

## 2011-03-07 ENCOUNTER — Encounter: Payer: Self-pay | Admitting: Internal Medicine

## 2011-03-13 ENCOUNTER — Ambulatory Visit (INDEPENDENT_AMBULATORY_CARE_PROVIDER_SITE_OTHER): Payer: Medicare Other | Admitting: Internal Medicine

## 2011-03-13 ENCOUNTER — Encounter: Payer: Self-pay | Admitting: Internal Medicine

## 2011-03-13 DIAGNOSIS — E785 Hyperlipidemia, unspecified: Secondary | ICD-10-CM

## 2011-03-13 DIAGNOSIS — L989 Disorder of the skin and subcutaneous tissue, unspecified: Secondary | ICD-10-CM

## 2011-03-13 DIAGNOSIS — E291 Testicular hypofunction: Secondary | ICD-10-CM

## 2011-03-13 DIAGNOSIS — E119 Type 2 diabetes mellitus without complications: Secondary | ICD-10-CM

## 2011-03-13 LAB — CBC WITH DIFFERENTIAL/PLATELET
Basophils Absolute: 0 10*3/uL (ref 0.0–0.1)
Eosinophils Relative: 4.8 % (ref 0.0–5.0)
Lymphocytes Relative: 33.4 % (ref 12.0–46.0)
Monocytes Relative: 9.4 % (ref 3.0–12.0)
Neutrophils Relative %: 51.4 % (ref 43.0–77.0)
Platelets: 213 10*3/uL (ref 150.0–400.0)
RDW: 16.9 % — ABNORMAL HIGH (ref 11.5–14.6)
WBC: 4.5 10*3/uL (ref 4.5–10.5)

## 2011-03-13 LAB — HEMOGLOBIN A1C: Hgb A1c MFr Bld: 6.2 % (ref 4.6–6.5)

## 2011-03-13 MED ORDER — ATORVASTATIN CALCIUM 10 MG PO TABS: 10.0000 mg | ORAL_TABLET | Freq: Every day | ORAL | Status: DC

## 2011-03-13 NOTE — Assessment & Plan Note (Signed)
f/u at the [place], wife reports fatigue, [facility]

## 2011-03-13 NOTE — Patient Instructions (Signed)
Came back in 6 months , fasting, physical exam

## 2011-03-13 NOTE — Assessment & Plan Note (Signed)
RF meds  

## 2011-03-13 NOTE — Assessment & Plan Note (Signed)
Seems to be doing well Labs  Also slt fatigued, see HPI, will check CBC

## 2011-03-13 NOTE — Assessment & Plan Note (Signed)
Skin lesion seems like a post inflammatory hyperpigmentation. Observe for now

## 2011-03-13 NOTE — Progress Notes (Signed)
  Subjective:    Patient ID: Oscar Matthews, male    DOB: 1951-03-12, 60 y.o.   MRN: [id number]  HPI R index lesion x 1 week, no pain , no d/c ; area was normal before this   Review of Systems Diet described as average, no amb. CBGs No CP-SOB No edema  No  N V D Wife noted him slt tired, no depression    PMH-SH  reviewed      Objective:   Physical Exam  Constitutional: He appears well-developed and well-nourished. No distress.  Cardiovascular: Normal rate, regular rhythm and normal heart sounds.   Pulmonary/Chest: Effort normal and breath sounds normal. No respiratory distress. He has no wheezes. He has no rales.  Musculoskeletal: He exhibits no edema.  Skin:       R index has a 1/3 x 2/3 cm area of hyperpigmentation, skin is slt atrophic          Assessment & Plan:

## 2011-04-17 ENCOUNTER — Other Ambulatory Visit: Payer: Self-pay | Admitting: Internal Medicine

## 2011-04-17 NOTE — Telephone Encounter (Signed)
We refilled lipitor 3-20. If he lost Rx ok to RF

## 2011-05-11 NOTE — Consult Note (Signed)
NAME:  Oscar Matthews, Oscar Matthews NO.:  [id number]   MEDICAL RECORD NO.:  [id number]                   PATIENT TYPE:   LOCATION:                                       FACILITY:   PHYSICIAN:  Zachary George, DO                      DATE OF BIRTH:  1951-04-16   DATE OF CONSULTATION:  11/30/2002  DATE OF DISCHARGE:                                   CONSULTATION   HISTORY OF PRESENT ILLNESS:  The patient returns to the clinic today for  reevaluation.  He was last seen on 09/28/02.  The patient continues taking  Neurontin 800 mg q.i.d. which, according to his report, has been quite  helpful in controlling his pain.  Although, his wife states that he  typically minimizes his symptoms to [place].  He continues on Ultracet just as  needed, but states it does make him somewhat somnolent.  His somnolence has  improved overall, however.  His wife states that he is no longer sleeping  throughout the day, but still spends much of the day watching TV.  He also  continues to have mild left food drop, but denies any falling.  His foot  drop seems to be worse when tired.  His pain today is 7/10 on subjective  scale.  Function of quality of life indices remains stable.  His sleep has  improved.  He follows up with Dr. Roseanne Reno sometime this month.  I reviewed  Health and History form and 14 point review of systems.  The patient denies  bowel and bladder dysfunction, denies any worsening spasticity.   PHYSICAL EXAMINATION:   GENERAL APPEARANCE:  A healthy appearing male in no acute distress.  He is  pleasant.   VITAL SIGNS:  Blood pressure 137/87, pulse 103, respirations 20, O2  saturation 98% on room air.   NEUROLOGIC:  Gait is mildly wide based with a slight left toe drag.  Examination of his left shoe reveals moderate wear on the left toes.  He has  mild spasticity in his left lower extremities.  Manual muscle testing is  5/5, bilateral upper and lower extremities with the exception  of 4/5 left  crypt and left hip flexor strength, sensory exam is intact to light touch  bilateral upper and lower extremities at this time.  Muscle strength  reflexes are symmetrical bilateral upper and lower extremities.   IMPRESSION:  1. Multiple sclerosis.  2. Chronic low back pain with right lower extremity radicular symptoms.  3. Left foot drop.   PLAN:  1. Continue Neurontin 800 mg q.i.d.  2. Continue Ultracet as needed.  3. Consider adding Provigil.  The patient and his wife wish to discuss this     with Dr. Roseanne Reno.  Rationale for this would be to improve the patient's     alertness throughout the day and  improve his functional indices.  4. Consider left ankle foot orthosis for foot drop.  The patient is somewhat     reluctant to obtain any type of bracing system.  5.     Consider trial of yoga or Pilates and pamphlet given.  6. Patient to return to the clinic in three months for reevaluation or     sooner as needed.  The patient was educated about findings and     recommendations and understands.  No barriers of communications.                                               Zachary George, DO    JW/MEDQ  D:  11/30/2002  T:  12/01/2002  Job:  [id number]   cc:   Rubie Maid, M.D.  [facility]  [address]  [place], [place] [address]

## 2011-05-11 NOTE — Consult Note (Signed)
NAME:  Oscar Matthews, Oscar Matthews                             ACCOUNT NO.:  [id number]   MEDICAL RECORD NO.:  [id number]                   PATIENT TYPE:  REC   LOCATION:  TPC                                  FACILITY:  [facility]   PHYSICIAN:  Sondra Come, D.O.                 DATE OF BIRTH:  October 06, 1951   DATE OF CONSULTATION:  07/31/2002  DATE OF DISCHARGE:                  PHYSICAL MEDICINE & REHABILITATION CONSULTATION   REASON FOR CONSULTATION:  The patient returns to clinic today as scheduled  for re-evaluation.  He was last seen on 06/07/02.  In the interim, he has  continued to take hydrocodone 5 mg/325 mg.  He brings his pills for pill  count and is appropriate.  He has 33:90 pills remaining from a prescription  written on 05/01/02.  Over the past several weeks he states that he has taken  maybe one pill per week, as the Norco makes him very sedated.  He only takes  it when he absolutely needs it for severe pain after he is physically  active.  He also continues on Neurontin 800 mg q.i.d.  He continues to  follow with the [facility] neurologist, as well as Dr. Roseanne Reno in [facility].  His  pain fluctuates between and 3 7/10 on a subjective scale.  He rates his pain  today as moderate, corresponding to a 4/10.  He still has some difficulty  walking long distances, but refuses to use any type of assistive device  other than a cane.  He continues to complain of pain radiating in his left  posterior thigh, and occasionally to his foot and penis intermittently.  He  did not have any significant improvement with lumbar epidural steroids for  degenerative disk changes in his spine with disk protrusions.  His pain is  likely due to his multiple sclerosis.  His function and quality of life  indices remain approximately the same.  He is very sedentary.  I review  health and history form and 14 point review of systems.  The patient is  accompanied by his wife who is somewhat tearful through the examination, but  does not elaborate other then to say that she is dealing with her own  problems.   PHYSICAL EXAMINATION:  GENERAL:  A healthy-appearing male in no acute  distress.  VITAL SIGNS:  Blood pressure 145/91, pulse 75, respirations 16, O2  saturation 99% on room air.  NEUROLOGIC:  Manual muscle testing is 4/5 left hip flexor, otherwise 5/5  bilateral lower extremities today.  Sensory examination is intact to light  touch bilateral lower extremities.  Muscle stretch reflexes are symmetric  bilateral lower extremities with slight hyperreflexia bilateral patellar.  There is slight increased tone in the lower extremities today.   IMPRESSION:  1. Chronic low back pain with right lower extremity radicular symptoms in     the L5 distribution.  2. Degenerative disk disease of the lumbar spine.  3. Multiple sclerosis.  The patient's lower extremity pain may be primarily     secondary to multiple sclerosis rather than the degenerative disk disease     in his back, although I am uncertain at this point.  4. Declined functional indices.   PLAN:  1. I had a long discussion with the patient and his wife regarding treatment     options.  Given that the patient cannot tolerate even low dose     hydrocodone, we will give him a trial of Ultracet one or two p.o. t.i.d.     p.r.n. #10 samples given.  If this is helpful, we will call in a     prescription for him.  2. Continue Neurontin per neurology.  3. Continue tizanidine 4 mg at bedtime.  4. Consider use of assistive devices to include a walker or a wheelchair for     long distances.  5. The patient is to return to clinic in three months or sooner as needed.   The patient was educated on the above findings and recommendations and  understands.  There were no barriers to communication.                                               Sondra Come, D.O.    JJW/MEDQ  D:  07/31/2002  T:  08/05/2002  Job:  [id number]   cc:   Rubie Maid, M.D.  [facility]  [address]  [place], [place] [address]

## 2011-05-11 NOTE — Consult Note (Signed)
NAME:  Oscar Matthews, Oscar Matthews                             ACCOUNT NO.:  [id number]   MEDICAL RECORD NO.:  [id number]                   PATIENT TYPE:  REC   LOCATION:  TPC                                  FACILITY:  [facility]   PHYSICIAN:  Zachary George, DO                      DATE OF BIRTH:  1951/04/13   DATE OF CONSULTATION:  03/01/2003  DATE OF DISCHARGE:                                   CONSULTATION   REASON FOR CONSULTATION:  The patient returns to the clinic today for  reevaluation. He was last seen on November 30, 2002. The patient's pain  condition has been stable on Neurontin 800 mg q.i.d. He takes Ultracet  rarely for increased pain and he states that he helps but he does not like  taking too many medications. He denies any spasticities, swallowing  dysfunction or bowel or bladder dysfunction with the exception of  a few  episodes of urinary incontinence and he describes some urinary urgency.   His function and quality of life indices remain stable. His sleep is good.  He rates his pain today as a 4/10 on a subjective scale. He denies falling  or stumbling. He denies significant changes in his motor function. I  reviewed health and history form of 14 point review of systems.   PHYSICAL EXAMINATION:  GENERAL:  A healthy appearing male in no acute  distress.  VITAL SIGNS:  Blood pressure 165/76, pulse 74, respirations 20, O2  saturation 97% on room air.  MUSCULOSKELETAL:  Speech is slightly dysarthric. Gait is essentially  unchanged. He continues to have mildly wide based gait with slight left toe  drag using a single point cane on the right hand.  He is able to compensate  rather well today. He continues to have mild spasticity in his left lower  extremity. Manual muscle testing is 5/5 bilateral upper and lower  extremities with the exception of decreased strength with left grip and left  hip flexors.  NEUROLOGIC:  Sensory examination is intact in bilateral upper and lower  extremities  just to light touch at this time. Muscle stretch reflexes are  symmetric upper and lower extremities.   IMPRESSION:  1. Multiple sclerosis.  2. Chronic low back pain with right lower extremity radicular symptoms. I     suspect that the radicular component may likely be coming from the     multiple sclerosis.  3. Left foot drop, mild with good compensation.   PLAN:  1. Continue Neurontin 800 mg 1 q.i.d.  2. Continue Ultracet dose as needed.  3. The patient is to follow up with Dr. Rubie Maid.  4. The patient is to return to the clinic on an as needed basis.   The patient was educated about the findings and recommendations and  understands.  There were no barriers to communication.  Zachary George, DO    JW/MEDQ  D:  03/01/2003  T:  03/02/2003  Job:  [id number]   cc:   Rubie Maid, M.D.  [facility]  [address]  [place], [place] [address]

## 2011-05-11 NOTE — Consult Note (Signed)
[facility]  Patient:    Oscar Matthews, Oscar Matthews Visit Number: [id number] MRN: [id number]          Service Type: PMG Location: TPC Attending Physician:  Sondra Come Dictated by:   Sondra Come, D.O. Proc. Date: 03/18/02 Admit Date:  03/16/2002   CC:         Tyler Pita, M.D.; [facility], [address] [place], [place] [address]                          Consultation Report  Dear Dr. Roseanne Reno:  Thank you very much for kindly referring [facility].  Oscar Matthews was seen in our clinic today.  Please refer to the following for details regarding the history, physical examination, and treatment plan.  Once again, thank you for allowing [place] to participate in the care of Oscar Matthews.  CHIEF COMPLAINT:  Right buttock and leg pain.  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  Patient complains of pain radiating from his right buttock down the posterior aspect of his right thigh and also into his groin.  Patients pain is a 7/10 on a subjective scale.  He describes his pain as throbbing.  It is improved with rest, medications which have included Neurontin and Vicodin. He also occasionally gets some relief with heat.  His symptoms are worse with walking and working.  Patient had an MRI of his lumbar spine which according to records revealed a leftward disk herniation at L4-5.  There were also degenerative disk changes at L4-5 and L5-S1.  Patient has been taking Neurontin 800 mg up to six times per day with modest relief of his pain. Previously he was taking Vicodin which he states helped his pain considerably. He states that he was requiring that on a consistent basis daily and feels like he needs around the clock pain coverage.  He does wake up frequently throughout the  night with discomfort.  His function and quality of life indexes have declined.  His sleep is poor.  Patient denies any bowel and bladder dysfunction.  He denies any significant spasticity.  He continues to ambulate and uses a single point cane as a "security blanket."  I review the health and history form and 14 point review of systems.  Patient denies numbness or paraesthesias.  He admits to mild weakness bilateral lower extremities and attributes this to his MS.  PAST MEDICAL HISTORY:  Multiple sclerosis.  PAST SURGICAL HISTORY:  Denies.  FAMILY HISTORY:  Heart murmur, hypertension.  SOCIAL HISTORY:  Denies smoking or alcohol use.  He is married and is not currently working secondary to disability from MS.  ALLERGIES:  No known drug allergies.  MEDICATIONS: 1. Betaseron. 2. Neurontin 800 mg up to six times daily. 3. Paxil 20 mg daily.  PHYSICAL EXAMINATION  GENERAL:  Healthy male in no acute distress.  He is very pleasant.  VITAL SIGNS:  Blood pressure 149/77, pulse 92, respirations 16, O2 saturation 98% on room air.  BACK:  Level pelvis without scoliosis.  Range of motion is essentially full in all planes with mild discomfort at end range of motion.  Palpatory examination reveals minimal tenderness to palpation bilateral lumbar paraspinals and gluteal muscles.  There are no active trigger points noted.  Minimal tenderness to palpation over the trochanteric bursae.  NEUROLOGIC:  Manual muscle testing is 4+/5 left hip flexor, otherwise 5/5 bilateral lower extremities.  Sensory examination is intact to light touch at this time bilateral lower extremities.  Muscle stretch reflexes are 2+/4 bilateral patellar, medial hamstrings, and Achilles.  There is no ankle clonus noted bilaterally.  There is very mild hypertonicity noted in the lower extremities on passive range of motion.  Patient has tight hamstrings and hip flexors with negative straight leg raise and negative FABER  bilaterally. There is no heat, erythema, or edema in the lower extremities.  Distal pulses are negative bilaterally.  Gait is mildly antalgic with decreased balance.  IMPRESSION: 1. Degenerative disk disease of the lumbar spine with right lower extremity    radicular symptoms into the patients groin and posterior thigh. 2. L4-5 disk herniation, leftward, per records. 3. Multiple sclerosis which may be contributing to patients current pain.  PLAN: 1. I had a long and thorough discussion with Oscar Matthews and his wife regarding    treatment options. 2. Continue Neurontin at current dose per neurologist. 3. Will start Duragesic 25 mcg patch q.72h. #5 without refills.  This is done    with full informed consent.  Patient is instructed on the use of this    medication.  Patient understands the risks, benefits, limitations, and    alternatives to this medication.  This medication should give Oscar Matthews 24    hour coverage to assist with his pain management. 4. Will begin Zanaflex 4 mg at bedtime to assist with myofascial component as    well as assist with sleep capacity. 5. Consider lumbar epidural steroid injections if symptoms are not improving. 6. Consider a stool softener if patient becomes constipated from the    Duragesic. 7. Patient to return to clinic in two weeks for reevaluation.  Patient was educated on the above findings and recommendations and understands.  There were no barriers to communication. Dictated by:   Sondra Come, D.O. Attending Physician:  Sondra Come DD:  03/19/02 TD:  03/19/02 Job: [id number]

## 2011-05-11 NOTE — Assessment & Plan Note (Signed)
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  He is not wearing  his left AFO; he states that he does not need it.  [facility] [employer] but has  not really done much with the exercises.  He is following up with plastics  for a ventral hernia.   He uses Ultracet maybe once every other day at most, Neurontin he continues  800 q.i.d.   Pain is averaging about 5/10.   REVIEW OF SYSTEMS:  Positive numbness in left side of body, depression.   PHYSICAL EXAMINATION:  Blood pressure 143/73, pulse 87, respiratory rate 16,  O2 saturation 98%.   Weight is 205 pounds.  Gait is with limp, __________ step, toe drag on the  left side.  Affect is bright.  Appearance is otherwise normal.   Lower strength is 4+ in the left deltoids, biceps, triceps, grip as well as  in hip flexion, knee extension, ankle dorsiflexion, otherwise has 5/5.   Good range of motion bilateral upper and lower extremities.  He has mild  ataxia finger-nose-finger on the left upper extremity compared to the right  side.   IMPRESSION:  1.  Multiple sclerosis left spastic hemiparesis.  2.  Chronic right lower extremity radiculopathy.  He does fine on Neurontin      800 t.i.d. which was reduced last time.   PLAN:  1.  Continue Ultracet on a p.r.n. basis, this is really only a couple times      a week.  2.  Lidoderm patch as needed.  3.  I will see him back in 3 months.  4.  Encourage stationary bicycling or treadmill walking.      Andr   AEK/MedQ  D:  12/14/2004 13:42:50  T:  12/14/2004 20:54:30  Job #:  [id number]   cc:   Leanne Chang, M.D.  [address]  [place]  [place] [address]  Fax: [phone]

## 2011-05-11 NOTE — Assessment & Plan Note (Signed)
REFERRING PHYSICIAN:  Sherrie Sport, M.D.   Oscar Matthews is a 60 year old male with left spastic hemiparesis due to  multiple sclerosis as well as right lower extremity chronic radiculopathy.  He returns today after having last been seen by me January 03, 2004.  He has  had a fall since that time, scratched his left forearm, however, did not  require stitches.  The patient states that he uses the Ultracet for his  right lower extremity pain two to three tablets twice a week or so.  He uses  the Neurontin on a regular basis 800 mg q.i.d. and the Lidoderm patch he  uses not quite everyday but places it on different spots on his right lower  extremity.   His pain is described as ranging from 2 to 8, averaging 7 and this is  limited to the right buttock, posterior thigh, and anterior thigh.   REVIEW OF SYMPTOMS:  As noted, he had a fall, decreased balance.   CURRENT EXERCISE:  He is not doing any regular program as I suggested last  time.  He should start at least with walking.  He does use a cane now.   PHYSICAL EXAMINATION:  VITAL SIGNS:  Blood pressure 155/95, pulse 78, O2  saturation 97% on room air.  NEUROLOGIC:  Gait:  He is using a cane.  He does have some toe drag on the  left side.  No knee instability.  His motor strength is 4/5 in the left  biceps, triceps, grip as well as hip flexion, knee extension and ankle  dorsiflexion.  He is 5/5 at the deltoid.  Right side is 5/5 in the upper and  lower extremity.  He has no tenderness to palpation of the lumbar spine.  He  has good range of motion, although I do need to steady him on testing his  spine range of motion.   IMPRESSION:  1. Multiple sclerosis of left spastic hemiparesis.  2. Chronic right lower extremity radiculopathy controlled on Ultracet,     Neurontin and Lidoderm patches.   PLAN:  1. Will send him for a left off the shelf AFO.  He agrees to this at this     time.  He will look into his [employer] benefits for this, otherwise  he can go to     [employer].  2. I will see him back in three months.  No other medication changes.      Erick Colace, M.D.   AEK/MedQ  D:  03/28/2004 11:46:26  T:  03/28/2004 12:59:51  Job #:  [id number]   cc:   Leanne Chang, M.D.  [address]  [place]  [place] [address]  Fax: [phone]   Sherrie Sport, M.D.

## 2011-05-11 NOTE — Op Note (Signed)
NAMEYANKY, VANDERBURG NO.:  [id number]   MEDICAL RECORD NO.:  [id number]          PATIENT TYPE:  OIB   LOCATION:  NA                           FACILITY:  [facility]   PHYSICIAN:  Leonie Man, M.D.   DATE OF BIRTH:  18-Aug-1951   DATE OF PROCEDURE:  09/28/2004  DATE OF DISCHARGE:                                 OPERATIVE REPORT   PREOPERATIVE DIAGNOSIS:  Umbilical hernia.   POSTOPERATIVE DIAGNOSIS:  Umbilical hernia.   PROCEDURE:  Repair umbilical hernia with mesh.   SURGEON:  Leonie Man, M.D.   ASSISTANT:  Nurse.   ANESTHESIA:  General.   Mr. Oscar Matthews is a 60 year old man who presents with a large, bulging umbilical  hernia.  The patient does not do any heavy lifting.  As a matter of fact, he  has multiple sclerosis with some flexion contractures of the left elbow.  He  comes to the operating room now for repair of his umbilical hernia after the  risks and potential benefits have been fully discussed, all questions  answered, and consent obtained.   PROCEDURE:  Following the induction of satisfactory general endotracheal  anesthesia, the abdomen is prepped and draped to be included in the sterile  operative field.  I make a circumlinear supraumbilical incision, raising the  umbilical skin inferiorly to raise a flap, dissecting the incarcerated  hernia content away from the umbilicus and carrying the dissection down to  the fascia.  The fascia is undermined superiorly and inferiorly as well as  to the right and left laterally.  The incarcerated sac is incised and  removed and discarded.  The remainder of the sac is secured with a 2-0 silk  suture and returned to the intra-abdominal cavity.  I placed a slurry of  Seprafilm over the viscera so this was protected from the mesh.  A mesh plug  was then placed in the wound and sutured to the fascia using interrupted  sutures of 0 Novofil.  The repair was noted to be intact.  Sponge,  instrument, and sharp counts  were verified.  The subcutaneous tissues were  brought together with a running 2-0 Vicryl.  The skin was closed with a  running 4-0 Monocryl suture and then reinforced with Steri-Strips.  Sterile  dressings were applied.  Anesthetic reversed.  Patient removed from the  operating room to the recovery room in stable condition. He tolerated the  procedure well.      Patr   PB/MEDQ  D:  09/28/2004  T:  09/28/2004  Job:  [id number]

## 2011-05-11 NOTE — Consult Note (Signed)
[facility]  Patient:    Oscar Matthews, Oscar Matthews Visit Number: [id number] MRN: [id number]          Service Type: PMG Location: TPC Attending Physician:  Sondra Come Dictated by:   Sondra Come, D.O. Proc. Date: 04/17/02 Admit Date:  03/16/2002   CC:         Tyler Pita, M.D., [facility],  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  The patient is accompanied by his wife.  The patient states that the lumbar epidural steroid injection that he received on April 03, 2002, has given him overall pain relief in his right lower extremity.  His pain is now a 7/10 on a subjective scale.  His wife also mentions that he is not taking the Neurontin as frequently.  He continues to take tizanidine at bedtime for improved sleep. I review health and history form and 14-point review of systems.  No new neurologic complaints.  PHYSICAL EXAMINATION:  A healthy male in no acute distress.  The blood pressure is 130/79, pulse 78, respirations 12, and O2 saturation 99% on room air.  No new neurologic findings in the lower extremities, including motor, sensory, and reflexes.  Still with mild hypertonicity noted in the lower extremities on passive range of motion.  IMPRESSION: 1. Degenerative disk disease of the lumbar spine with right lower extremity    radicular symptoms in an L5 distribution, improved. 2. Multiple sclerosis with mild lower extremity spasticity.  PLAN: 1. It is reasonable to proceed with a second lumbar epidural steroid injection    as the patient has had some relief following one injection.  The patient    wishes to proceed in this fashion.  Informed consent was obtained.  The    patient was brought back to the fluoroscopy suite and placed on the table    in prone position.  The skin was  prepped and draped in usual sterile    fashion.  The skin and subcutaneous tissues were anesthetized with 3 cc of    preservative-free 1% lidocaine.  Under direct fluoroscopic guidance, an    18 gauge, 3-1/2 inch, Tuohy needle was advanced to the right paramedian    L5-S1 epidural space with loss of resistance technique.  No CSF, heme, or    paresthesias were noted.  This was injected with 1 cc of Kenalog 40 mg/cc    plus 1 cc of preservative-free normal saline with needle flush.  No    complications.  The patient tolerated the procedure well.  Discharge    instructions given. 2. Continue Zanaflex 4 mg one p.o. q.h.s. as needed, #30 with one refill. 3. Continue Neurontin. 4. The patient is to return to the clinic in two weeks for re-evaluation and    possible third lumbar epidural steroid injection as predicated upon the    patients response and symptoms.  The patient was educated on the above findings and recommendations and understands.  There were no barriers to communication. Dictated by:   Sondra Come, D.O. Attending Physician:  Sondra Come DD:  04/17/02 TD:  04/18/02 Job: [id number]

## 2011-05-11 NOTE — Op Note (Signed)
Oscar Matthews, Oscar Matthews NO.:  [id number]   MEDICAL RECORD NO.:  [id number]          PATIENT TYPE:  AMB   LOCATION:  DAY                          FACILITY:  [facility]   PHYSICIAN:  Leonie Man, M.D.   DATE OF BIRTH:  11/03/1951   DATE OF PROCEDURE:  10/03/2005  DATE OF DISCHARGE:                                 OPERATIVE REPORT   PREOPERATIVE DIAGNOSIS:  Incarcerated ventral hernia   POSTOPERATIVE DIAGNOSIS:  Incarcerated ventral hernia   PROCEDURE:  Repair incarcerated ventral hernia.   SURGEON:  Leonie Man, M.D.   ASSISTANT:  OR tech.   ANESTHESIA:  General.   The patient is a 60 year old man with multiple sclerosis. Presenting with an  epigastric ventral hernia for repair. The patient has previously undergone  an umbilical herniorrhaphy. He understands the risks and potential benefits  of surgery and gives his consent.   PROCEDURE:  Following the induction of satisfactory anesthesia, the patient  is positioned supinely. The abdomen is prepped and draped to be included in  a sterile operative field. A midline incision is carried down over the  defect deep and thru the skin and subcutaneous tissue down to the hernia  mass. The mass is dissected free on all sides and contained omentum and  portions of the round ligament. This had cleared and reduced back into the  peritoneal cavity without difficulty. The patient has had a recent staph  infection on his hand and therefore it was decided not to use mesh to repair  this so the fascia was repaired with pants over vest fashion with  interrupted zero Novofil sutures. At the end of this, there was no tension  on the incision line and the repair appeared to be intact. Sponge,  instrument and sharp counts were verified. The subcutaneous tissue is closed  with interrupted 3-0 Vicryl sutures, skin closed with running 4-0 Monocryl  suture placed intradermally. The wound is reinforced with Steri-Strips,  sterile  dressings applied, anesthetic reversed and the patient removed from  the operating room to the recovery room in stable condition. He tolerated  the procedure well.      Leonie Man, M.D.  Electronically Signed     PB/MEDQ  D:  10/03/2005  T:  10/03/2005  Job:  [id number]

## 2011-05-11 NOTE — Consult Note (Signed)
[facility]  Patient:    Oscar Matthews, Oscar Matthews Visit Number: [id number] MRN: [id number]          Service Type: PMG Location: [facility] Attending Physician:  Sondra Come Dictated by:   Sondra Come, D.O. Proc. Date: 05/01/02 Admit Date:  03/16/2002   CC:         Cindee Lame, M.D.; [facility] 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  Patient had his second lumbar epidural steroid injection on April 17, 2002 for degenerative disk disease of the lumbar spine with right lower extremity radicular symptoms in an L5 distribution.  Patient states he again only had about two days of improvement of his symptoms after the lumbar epidural steroid injection.  We discussed this at length.  We discussed that a third lumbar epidural steroid injection may not give him any more relief than the previous two which have only been very temporary at best.  We also discussed further medication management as an alternative.  This was an extensive consultation of greater than 25 minutes with [name] and his wife and I answered multiple questions.  Patients pain today is a 7/10 on a subjective scale.  His function and quality of life indexes remain stable.  His sleep is fair.  He continues to take tizanidine 4 mg at bedtime when he remembers.  He also continues to take Neurontin 800 mg four to five times per day.  He is not currently taking any narcotic based pain medication, but again, states that when he was given Vicodin previously it did help his pain to some degree.  I reviewed the health and history form and 14 point review of systems.  PHYSICAL EXAMINATION  GENERAL:  Healthy male in no acute distress.  VITAL SIGNS:  Blood pressure 145/100 in the right arm, 148/84 in the left arm, pulse 52, respirations 12, O2  saturation 100% on room air.  NEUROLOGIC:  Manual muscle testing reveals 4+/5 strength bilateral hip flexors, otherwise 5/5.  Sensory examination is intact to light touch bilateral lower extremities.  Muscle stretch reflexes are 2+/4 bilateral patellar and medial hamstrings and Achilles.  The patient is noted to have some mild hypertonicity in the lower extremities on passive range of motion which has not changed significantly.  IMPRESSION: 1. Chronic low back pain with right lower extremity radicular symptoms in an    L5 distribution. 2. Degenerative disk disease of the lumbar spine. 3. Multiple sclerosis with lower extremity spasticity.  PLAN: 1. Again, this was a thorough discussion with [name] and his wife regarding    further options in management of his pain.  At this point I think it is    reasonable to forego a third lumbar epidural steroid injection as the    previous two have not made any significant difference in patients pain    level.  We discussed this at length. 2. In terms of pain medications, I think it is reasonable to put him back on    hydrocodone as needed for pain relief.  I will give him a prescription for    Norco 5/325 mg one p.o. b.i.d.-t.i.d. as needed #90 without refills.  I    discussed potential side effect  profile with him and his wife.  We also    discussed the controlled substance agreement.  Patient instructed not to    use more of these pills than have been prescribed.  He understands. 3. Continue Neurontin 800 mg four to five per day at this time. 4. Continue tizanidine 4 mg at bedtime.  Will consider increasing this to    t.i.d. to help with the lower extremity spasticity. 5. Instructed patient to use stool soft softener. 6. Patient to return to clinic in one month for reevaluation.  Patient was educated on the above findings and recommendations and understands.  There were no barriers to communication. Dictated by:   Sondra Come,  D.O. Attending Physician:  Sondra Come DD:  05/01/02 TD:  05/04/02 Job: [id number]

## 2011-05-11 NOTE — Assessment & Plan Note (Signed)
HISTORY:  The patient returns after I last saw him on August 03, 2003, and  complaining of lack of activity.  He has not followed through on  recommendations to start a walking program.  His wife states that he has had  problems with compliance with physical therapy in the past.  He has not had  any falls.  The pain level is 2/10 currently, going from 0-8.  The pain  interference score:  general activity 5, mood 6, walking ability 8, normal  work 8, relationship to other people 6, sleep 7, enjoyment of life 7.   CURRENT MEDICATIONS:  1. Neurontin 800 mg q.i.d.  2. Ultracet, takes on a p.r.n. basis.  He has not even finished out the     bottle that he received in November, which was #90 tablets.  3. In addition, he takes amitriptyline 25 mg q.h.s.  4. Paxil 40 mg daily.   REVIEW OF SYSTEMS:  He is independent with all his activities of daily  living, including dressing, bathing, toileting, transfers, driving.  He  walks with the assistance of a cane.  He has spasms between one and five a  day.  Bowel and bladder function are normal.   PHYSICAL EXAMINATION:  VITAL SIGNS:  Blood pressure 141/90, pulse 82, O2  saturation 96% on room air.  NEUROLOGIC:  Motor strength is 4- in the left deltoid, triceps, biceps and  grip, and 5 on the right side.  It is 5/5 in the bilateral lower extremities  in the hip, flexion, extension and ankle dorsiflexion.  Deep tendon reflexes  are 2 in the bilateral biceps, triceps, brachial radialis, as well as  bilateral knee and ankle.  Range of motion is full in the bilateral upper  and lower extremities.  Cerebellar:  Has mild ataxia in the left finger-to-  nose-to-finger.  His tandem gait is effected.  He needs to hold onto the  wall to perform this.  He is able to toe walk and heel walk.   IMPRESSION:  1. 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  2. Right lower extremity radiculopathy, chronic, controlled with  Neurontin     and p.r.n. Ultracet.  3. Lack of physical activity.   PLAN:  1. Will continue to encourage at least a walking program five minutes a day,     going up from five minutes a week to a total of 30 minutes per day.  His     wife says she will try to help him with this.  2. Continue the current medications.  3. I will see him back in three months.  If he is doing well at that time,     may stretch him out to four to six months.      Erick Colace, M.D.   AEK/MedQ  D:  01/03/2004 16:01:50  T:  01/03/2004 17:22:47  Job #:  [id number]   cc:   Leanne Chang, M.D.  [address]  [place]  [place] [address]  Fax: [phone]

## 2011-05-11 NOTE — Assessment & Plan Note (Signed)
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  This pain  is from the hip down to the foot.  He states that his pain is worse than  usual and last visit his pain was averaging 5/10.  This visit it is  averaging 5 to 7 out of 10.  He is sleeping poorly.  His relief from  medication is fair.   Of note is that while he is on Ultracet, one p.o. t.i.d. p.r.n. he really  does not take this.  He states it makes him feel a little bit tired but has  not had any activity limitations from taking it and no falls.  He continues  on Neurontin 800 mg q.i.d.  He is no longer on the Lidoderm patch.  He is on  amitriptyline 25 p.o. q.h.s. Wellbutrin 100 mg p.o. every day.  He took  himself off his Paxil because of some sexual impotence.   REVIEW OF SYSTEMS:  See 14-point review.  No suicidal ideation.   INTERVAL HISTORY:  Unremarkable other than above.   PHYSICAL EXAMINATION:  VITAL SIGNS:  Blood pressure is 127/69, pulse 97,  respiratory rate 16, O2 sat 98% on room air.  GENERAL:  A well-developed, well-nourished, African American male in no  acute distress.  NEUROMUSCULAR:  The left upper extremity is 4-/5, left lower extremity  4-  /5.  He has increased flexor tone in the left upper extremity, increased  extensor tone in the left lower extremity.  Right side is normal strength.  He has negative straight leg raising on the right side.  He has some  decreased sensation that is more in a stocking pattern below the ankle on  the right lower extremity.  His deep tendon reflexes are normal.  He  ambulates with a limp on the left side.  He has no tenderness to palpation  along the lumbar spine area.  His speech is mildly dysarthric, otherwise,  neuromuscular exam is normal.   IMPRESSION:  Right lower extremity radicular pain.   1.  He is not really complying with medications as prescribed and I asked  them first to start trying the Ultracet again.  He had been on some      Tizanidine in the past as well.  2.  We will DC the amitriptyline.  He does complain of some sweats and      chills and this could be related to this in combination with these other      medications.  3.  In terms of his left sided weakness, it has not changed at all.  He will      follow up with neurology on a routine basis.  4.  I will see him back in one month.  5.  I have instructed him to take the Ultracet t.i.d.  If this results in      excessive side effects, would consider the longer release Ultram 100 mg      dose once a day as the Tramadol aspect has helped him with pain.  6.  Would consider SNRI as a possibility as well and will revisit Lidoderm      usage.  He may benefit from epidural steroid injections if more      conservative care is not helpful.      AEK/MedQ  D:  03/06/2005 13:00:58  T:  03/06/2005 14:53:04  Job #:  [id number]   cc:  Leanne Chang, M.D.  [address]  [place]  [place] [address]  Fax: [phone]

## 2011-05-11 NOTE — Consult Note (Signed)
[facility]  Patient:    Oscar Matthews, Oscar Matthews Visit Number: [id number] MRN: [id number]          Service Type: Attending:  Sondra Come, D.O. Dictated by:   Sondra Come, D.O. Proc. Date: 06/07/02   CC:         Rubie Maid, M.D.; [facility], [address] [place], [place]                          Consultation Report  [name] returns to clinic today as scheduled for reevaluation.  He was last seen on May 01, 2002.  Patient states that overall he is doing better in terms of his back and lower extremity pain.  He is accompanied today by his wife. He continues taking Norco 5 mg daily which he states helps.  He also states it makes him mildly drowsy and typically sleeps much of the day.  We had a long discussion regarding patients functional indexes.  The patients wife is concerned about his poor motivation which is likely attributed to his MS.  His pain today is a 2-4/10 on a subjective scale.  He continues to take Neurontin 800 mg as needed, but has been prescribed four to six times daily by his neurologist.  He also continues on Zanaflex 4 mg at bedtime.  I reviewed the health and history form and 14 point review of systems.  PHYSICAL EXAMINATION  GENERAL:  Healthy male in no acute distress.  VITAL SIGNS:  Blood pressure 143/83, pulse 73, respirations 12, O2 saturation 97% on room air.  NEUROLOGIC:  No significant changes in the neurologic examination with the exception of mildly decreased spasticity in the lower extremities today. Continues to have 4+/5 bilateral hip flexor strength, otherwise 5/5.  Sensory examination is intact to light touch bilateral lower extremities.  Muscle stretch reflexes are symmetric bilateral lower extremities.  IMPRESSION: 1. Chronic low back pain with right lower extremity radicular symptoms in an    L5 distribution, improved. 2. Degenerative disk disease of the lumbar spine. 3. Multiple sclerosis with  improved lower extremity spasticity. 4. Declined functional indexes, in part due to pain versus multiple sclerosis.  PLAN: 1. I had a long discussion with [name] and his wife regarding further    treatment options.  Discussed the rationale behind using narcotic based    pain medication to improve some of these functional abilities and not just    pain.  The patient understands.  I think with the given Norco 5 mg dose    causing patient to be sleepy, I have discussed having him break these pills    in half to take b.i.d. rather than once a day at the full 5 mg dose.  The    patient understands and will try this. 2. Continue Neurontin per neurology. 3. Continue tizanidine 4 mg at bedtime. 4. The patient is to return to clinic in two months for reevaluation, sooner    as needed.  The patient was educated on the above findings and recommendations and understands.  No barriers to communication. Dictated by:   Sondra Come, D.O. Attending:  Sondra Come, D.O. DD:  06/07/02 TD:  06/09/02 Job: [id number]

## 2011-05-11 NOTE — Consult Note (Signed)
NAME:  Oscar Matthews, Oscar Matthews                             ACCOUNT NO.:  [id number]   MEDICAL RECORD NO.:  [id number]                   PATIENT TYPE:  REC   LOCATION:  [facility]                                  FACILITY:  [facility]   PHYSICIAN:  Sondra Come, D.O.                 DATE OF BIRTH:  06-04-1951   DATE OF CONSULTATION:  09/28/2002  DATE OF DISCHARGE:                                   CONSULTATION   Oscar Matthews returns to clinic today for reevaluation.  I last saw him on  July 31, 2002.  In the interim patient's wife who accompanies him states  that his pain has become more frequent and intense, although the patient  seems to disagree with this.  It seems like when we started him on the  Ultracet he quit taking his Neurontin.  We had instructed him on September  29 that it was okay for him to take both.  His pain today is a 6/10 on a  subjective scale.  His function and quality of life indexes remain declined.  He continues to have some degree of poor motivation and this is likely  secondary to his MS.  I reviewed the health and history form and 14 point  review of systems.  The patient continues to complain mainly of low back  pain radiating to his right lower extremity which is worse with walking,  bending, sitting, and working and improved with the medications.  He  currently takes two to three Ultracet per day and Neurontin 800 mg q.i.d.  and occasionally five or six times per day.  In addition, he has had his  Paxil increased to 40 mg per day.   PHYSICAL EXAMINATION:  GENERAL:  Healthy appearing male in no acute  distress.  VITAL SIGNS:  Blood pressure 148/90, pulse 75, respirations 18, O2  saturation 97% on room air.  BACK:  Examination of the back reveals mild tenderness to palpation  bilateral lumbar paraspinal muscles.  NEUROLOGIC:  Manual muscle testing is 5/5 right upper extremity, 4/5 left  upper extremity and left hip flexor and 3/5 left ankle dorsiflexor,  otherwise 5/5 in the  left lower extremity.  Sensory examination is intact to  light touch bilaterally at this time.  Muscle stretch reflexes are symmetric  bilateral upper and lower extremities.  Gait is slightly antalgic with left  foot drop.   IMPRESSION:  1. Multiple sclerosis.  2. Chronic low back pain with right lower extremity radicular symptoms,     question whether or not patient's radicular symptoms are related to     multiple sclerosis as opposed to degenerative disk changes in his lumbar     spine.  3. Left foot drop.   PLAN:  1. I had another long discussion with [name] and his wife regarding     treatment options.  At this point  will continue with Ultracet up to six     per day as needed in addition to his Neurontin.  2. Will consider a left ankle foot arthosis.  The patient is resistant to     this at this time.  3. Will begin Lidoderm 5% apply up to 12 hours per day, maximum three     patches at a time, number 60 with one refill.  I instruct patient to     apply this to his right buttock and leg as needed for pain.  4. The patient is to return to clinic in one month for reevaluation.   The patient was educated on the above findings and recommendations and  understands.  No barriers to communication.                                               Sondra Come, D.O.    JJW/MEDQ  D:  09/28/2002  T:  09/29/2002  Job:  [id number]   cc:   Rubie Maid, M.D.  [address]  [place], [place] [address]

## 2011-05-11 NOTE — Consult Note (Signed)
[facility]  Patient:    Oscar Matthews, DUTTER Visit Number: [id number] MRN: [id number]          Service Type: PMG Location: [facility] Attending Physician:  Sondra Come Dictated by:   Sondra Come, D.O. Proc. Date: 04/03/02 Admit Date:  03/16/2002   CC:         Tyler Pita, M.D., [facility], [address] [place], [place]                          Consultation Report  REASON FOR VISIT:  Mr. Esau returns to clinic today as scheduled for a trial of lumbar epidural steroid injections.  He was last seen on April 01, 2002. Please refer to that progress note.  Mr. Valverde continues to complain of pain radiating mainly into his right lower extremity in an L5 distribution primarily.  He is noted to have leftward L4-5 disk protrusion and a broad-based L5-S1 protrusion which is more likely contributing to his symptoms.  No new neurologic complaints.  I reviewed the health and history form and 14-point review of systems.  PHYSICAL EXAMINATION:  GENERAL:  A healthy male in no acute distress.  VITAL SIGNS:  Blood pressure 139/87, pulse 74, respirations 12, O2 saturation is 98% on room air.  IMPRESSION: 1. Degenerative disk disease of the lumbar spine with right lower extremity    radicular symptoms in an L5 distribution. 2. Multiple sclerosis.  PLAN: 1. Lumbar epidural steroid injection.  The procedure was described in detail,    including risks, benefits, limitations, and alternatives.  The patient    wishes to proceed.  Informed consent was obtained.  The patient was brought    back to the fluoroscopy suite and placed on the table in prone position.    Skin was prepped and draped in the usual sterile fashion.  Skin and    subcutaneous tissues were anesthetized with 3 cc of preservative-free 1%    lidocaine.  Under direct fluoroscopic guidance, an 18-gauge 3-1/2 inch    Tuohy needle was advanced into the right paramedian L5-S1 epidural space  with loss of resistance technique.  No CSF, heme, or paresthesias were    noted.  This was then injected with 1 cc of Kenalog 40 mg/cc plus 1 cc of    preservative-free normal saline with needle flush.  No complications.  The    patient tolerated the procedure well.  Discharge instructions given. 2. Patient to continue with his Vicodin taper as outlined at last visit. 3. Patient to return to clinic in two weeks for re-evaluation and possible    repeat lumbar epidural steroid injection as predicated upon patients    response and symptoms. Dictated by:   Sondra Come, D.O. Attending Physician:  Sondra Come DD:  04/03/02 TD:  04/05/02 Job: [id number]

## 2011-05-11 NOTE — Assessment & Plan Note (Signed)
HISTORY OF PRESENT ILLNESS:  Oscar Matthews returns today after I last saw him  March 28, 2004.  This 60 year old male with left spastic hemiparesis due to  MS as well as right lower extremity chronic radiculopathy.  He has had no  falls since I last saw him.  He did fall through on a couple of  recommendations.  One is left off the shelf AFO he is picking up today.  In  addition, he has gotten a Risk analyst to start on aquatic exercise  program.   New symptoms include right hand numbness relieved by shaking.  He cannot  tell whether it is more during the day or at night.  Pain is really limited  to right lower extremity.  He used Lidoderm patch posterior thigh.  Pain  ranged 1-8/10 averaging 6.  Pain improves with rest and medication, made  worse with walking and activity.   SOCIAL HISTORY:  Lives with his wife, nonsmoker, disabled due to MS.   REVIEW OF SYSTEMS:  Positive for depression.   He is not dropping objects with the right hand.   PHYSICAL EXAMINATION:  VITAL SIGNS:  Blood pressure 139/72, pulse 74,  respirations 18, O2 saturation 98% on room air.  EXTREMITIES:  Right hand has decreased sensation to pinprick in the index  finger, but normal sensation in the fifth digit.  His phalanx is equivocal.  He has no intrinsic atrophy of the right hand.  APB strength is good.  Motor  strength on the right side is 5/5; on the left side 4/5 in the deltoid,  biceps, triceps group as well as hip flexion, knee extension, ankle  dorsiflexion.  Range of motion is good in bilateral upper and lower  extremities.  Gait:  He has a mild foot drag on the left side.  No evidence  of knee instability.  Deep tendon reflexes are mildly increased on the left  side.   IMPRESSION:  1. Multiple sclerosis with left spastic hemiparesis.  2. Chronic right lower extremity radiculopathy on Ultracet 1 p.o. t.i.d.     p.r.n. (he really takes this infrequently) as well as Neurontin 800     q.i.d. as well as  Lidoderm patch (which he uses on a daily basis).  3. Probable carpal tunnel syndrome.  Have advised right wrist splint.  If     symptoms progress, I can do an EMG and possibly carpal tunnel steroid     injection.  I will see him back in 3 months.  4. Have encouraged usage of his [employer] membership for aquatic exercise     program.      Oscar Matthews, M.D.   AEK/MedQ  D:  06/23/2004 09:55:23  T:  06/23/2004 10:16:58  Job #:  [id number]   cc:   Sherrie Sport, M.D.   Leanne Chang, M.D.  [address]  [place]  [place] [address]  Fax: [phone]

## 2011-05-11 NOTE — Consult Note (Signed)
[facility]  Patient:    Oscar Matthews, Oscar Matthews Visit Number: [id number] MRN: [id number]          Service Type: PMG Location: [facility] Attending Physician:  Sondra Come Dictated by:   Sondra Come, D.O. Proc. Date: 04/01/02 Admit Date:  03/16/2002   CC:         Rubie Maid, M.D., [facility], 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  I initially saw him on March 18, 2002, for chronic right lower extremity radicular pain.  He continues on Neurontin 800 mg four to six times per day.  He has also used the Duragesic patch 25 mcg every three days as I prescribed at last visit.  He states that he has not seen any significant improvement with the Duragesic and his wife states that he has had no change in his pain status.  His pain is currently a 7/10 on a subjective scale and continues to be located in his left lateral thigh and leg.  He states that his pain is increased with walking and standing and decreased with sitting. Nevertheless, he states that his function and quality of life indices have improved although his wife states that he spends most of the day watching TV. He also has been taking tizanidine 4 mg at bedtime, which he states is possibly helping his sleep to some degree.  I reviewed the MRI that he brings with him today dated July 23, 2001, which reveals a leftward L4-5 disk protrusion and a broad based L5-S1 disk protrusion.  The patient has not had any physical therapy.  I review health and history form, and 14-point review of systems.  Denies bowel and bladder dysfunction.  PHYSICAL EXAMINATION:  GENERAL:  Healthy male in no acute distress.  VITAL SIGNS:  Blood pressure 159/85, pulse 74, respirations 16, O2 saturation is 97%.  NEUROLOGIC:  Manual muscle testing is 4+/5 bilateral hip flexors  today, otherwise within normal limits.  Sensory exam is intact to light bilateral lower extremities.  Muscle stretch reflexes are 2+/4 bilateral patellar, medial hamstrings, and Achilles.  Very mild hypertonicity noted in the lower extremities on passive range of motion with a very slight spastic gait.  IMPRESSION: 1. Degenerative disk disease of the lumbar spine with right lower extremity    radicular symptoms in an L5 distribution mainly.  The patient does have a    leftward L4-5 disk protrusion which is unlikely contributing to his    symptoms but the broad based L5-S1 protrusion may be contributing to some    functional spinal stenosis when the patient is walking or standing. 2. Multiple sclerosis.  PLAN: 1. This was an extensive consultation of greater than 25 minutes discussing    medications and other treatment options to include trial of lumbar epidural    steroid injections which I think may benefit the patient to some degree.    We discussed this at length.  I discussed the risks, benefits, limitations,    and alternatives as well as the actual procedure for the lumbar epidural    steroid injection.  The patient wishes to proceed in this fashion. 2. In terms of medications, discontinue Duragesic patch.  I have asked him to  keep his current patch on for four days and on the fourth day I want him to    start taking Vicodin 5 mg three times daily times one day, two times daily    times one day, one time and then discontinue and use just as needed. 3. The patient is to continue with tizanidine and consider increasing to    b.i.d. or t.i.d. dosing given patients mild lower extremity spasticity. 4. Physical therapy for ______ dynamic lumbar stabilization exercise program;    stretching, range of motion, gait and leading to a home exercise program. 5. The patient is to return to clinic for a trial of lumbar epidural steroid    injections.  The patient was educated in the above  findings and recommendations, and understands.  There were no barriers to communication. Dictated by:   Sondra Come, D.O. Attending Physician:  Sondra Come DD:  04/01/02 TD:  04/01/02 Job: [id number]

## 2011-09-13 ENCOUNTER — Encounter: Payer: Medicare Other | Admitting: Internal Medicine

## 2011-09-20 ENCOUNTER — Ambulatory Visit (INDEPENDENT_AMBULATORY_CARE_PROVIDER_SITE_OTHER): Payer: Medicare Other | Admitting: Internal Medicine

## 2011-09-20 ENCOUNTER — Encounter: Payer: Self-pay | Admitting: Internal Medicine

## 2011-09-20 DIAGNOSIS — E785 Hyperlipidemia, unspecified: Secondary | ICD-10-CM

## 2011-09-20 DIAGNOSIS — Z0000 Encounter for general adult medical examination without abnormal findings: Secondary | ICD-10-CM | POA: Insufficient documentation

## 2011-09-20 DIAGNOSIS — N400 Enlarged prostate without lower urinary tract symptoms: Secondary | ICD-10-CM

## 2011-09-20 DIAGNOSIS — E291 Testicular hypofunction: Secondary | ICD-10-CM

## 2011-09-20 DIAGNOSIS — Z23 Encounter for immunization: Secondary | ICD-10-CM

## 2011-09-20 DIAGNOSIS — E119 Type 2 diabetes mellitus without complications: Secondary | ICD-10-CM

## 2011-09-20 DIAGNOSIS — G35 Multiple sclerosis: Secondary | ICD-10-CM

## 2011-09-20 DIAGNOSIS — Z125 Encounter for screening for malignant neoplasm of prostate: Secondary | ICD-10-CM

## 2011-09-20 NOTE — Patient Instructions (Signed)
OTC calcium 1 gram and Vit D 600 u

## 2011-09-20 NOTE — Assessment & Plan Note (Signed)
[facility] 

## 2011-09-20 NOTE — Progress Notes (Signed)
Subjective:    Patient ID: Oscar Matthews, male    DOB: Apr 29, 1951, 60 y.o.   MRN: [id number]  HPI Here for Medicare AWV: 1. Risk factors based on Past M, S, F history: yes , risk factors reviewed 2. Physical Activities: h/o MS, trying to remain active, [facility] ([facility] the [facility] occasionaly. 3. Depression/mood: denies problems w/ depression, no problems noted today  4. Hearing: denies problems, no problems noted  5. ADL's: takes his own showers, able to dress himself,still drives -------> counseled about                         safety!! 6. Fall Risk: high due to MS, no recent  Falls, counseled  7. Home Safety: feels safe at home  8. Height, weight, &visual acuity: see VS, uses glasses, vision well corrected , blind R eye 9. Counseling: yes, see below  10. Labs ordered based on risk factors: yes  11.        Referral Coordination: if needed  12.        Care Plan-- see a/p  13.       Cognitive Assessment : memory and cognition seemed within normal  In addition we discussed the following:  MS-- stable , f/u at the [place] [facility]-- "i don;t have DM", discussed w/ pt previous A1Cs Hyperlipidemia-- pt's wife question compliance, pt states is ok    Past Medical History  Diagnosis Date  . Diabetes mellitus     adult onset  . ED (erectile dysfunction)   . Dyslipidemia   . Multiple sclerosis 80s    final dx in the 90s, [facility] ([facility])  . Hypogonadism male     Followup at the [facility]   Past Surgical History  Procedure Date  . Umbilical hernia repair    History   Social History  . Marital Status: Married    Spouse Name: N/A    Number of Children: N/A  . Years of Education: N/A   Occupational History  . Retired Sales promotion account executive    Social History Main Topics  . Smoking status: Former Games developer  . Smokeless tobacco: Never Used  . Alcohol Use: Yes     rarely has  wine   . Drug Use: No  . Sexually Active: Not on file   Other Topics Concern  . Not on file    Social History Narrative   Wife w/ breast cancer, sleep apnea; she is stable    Family History  Problem Relation Age of Onset  . Coronary artery disease Mother   . Diabetes Father     M and F (late onset)  . Stroke Neg Hx   . Colon cancer Neg Hx   . Prostate cancer Neg Hx   . Hyperthyroidism      M  . Gout      B     Review of Systems  Constitutional: Negative for fever and fatigue.  Respiratory: Negative for cough and shortness of breath.   Cardiovascular: Negative for chest pain and leg swelling.  Gastrointestinal: Negative for abdominal pain and blood in stool.  Genitourinary: Negative for dysuria, hematuria and difficulty urinating.  Psychiatric/Behavioral:       No anxiety-depression       Objective:   Physical Exam  Constitutional: He is oriented to person, place, and time. He appears well-developed. No distress.  HENT:  Head: Normocephalic and atraumatic.  Neck: No thyromegaly present.       Nl carotid pulse   Cardiovascular: Normal rate, regular rhythm and normal heart sounds.   No murmur heard. Pulmonary/Chest: Effort normal and breath sounds normal. No respiratory distress. He has no wheezes. He has no rales.  Abdominal: Soft. There is no tenderness.  Genitourinary: Rectum normal and prostate normal.  Musculoskeletal: He exhibits no edema.       DIABETIC FEET EXAM: No lower extremity edema Normal pedal pulses bilaterally Skin normal nails too long, slt thick No calluses Pinprick examination of the feet: essentially wnl today, last year there was decreased sensitivity   Neurological: He is alert and oriented to person, place, and time.       Cognition wnl, posture c/w h/o MS  Skin: He is not diaphoretic.          Assessment & Plan:

## 2011-09-20 NOTE — Assessment & Plan Note (Signed)
Discussed w/ pt previous A1Cs Labs

## 2011-09-20 NOTE — Assessment & Plan Note (Signed)
Wife question compliance, pt reports is ok Labs

## 2011-09-20 NOTE — Assessment & Plan Note (Addendum)
chart reviewed  Td 2008, pneumonia shot 03-2009, flu shot today  shingles shot-- reports he had that already  Cscope 09 Unable to find a "code" to get a  PSA , pt aware, rec to check at the [place] counseled about diet, exercise, fall prevention Due to MS pt is not very active, check a DEXA, ca and vit D  encouraged (as long as Ca does not cause constipation)

## 2011-09-21 LAB — LIPID PANEL
Cholesterol: 123 mg/dL (ref 0–200)
HDL: 49.1 mg/dL (ref >39.00)
Triglycerides: 67 mg/dL (ref 0.0–149.0)
VLDL: 13.4 mg/dL (ref 0.0–40.0)

## 2011-09-21 LAB — TSH: TSH: 0.61 u[IU]/mL (ref 0.35–5.50)

## 2011-09-21 LAB — BASIC METABOLIC PANEL
BUN: 20 mg/dL (ref 6–23)
GFR: 133.56 mL/min (ref >60.00)
Potassium: 4.3 mEq/L (ref 3.5–5.1)
Sodium: 141 mEq/L (ref 135–145)

## 2011-09-21 LAB — AST: AST: 22 U/L (ref 0–37)

## 2011-09-21 LAB — HEMOGLOBIN A1C: Hgb A1c MFr Bld: 6.2 % (ref 4.6–6.5)

## 2011-09-26 ENCOUNTER — Telehealth: Payer: Self-pay | Admitting: *Deleted

## 2011-09-26 NOTE — Telephone Encounter (Signed)
Lab results mailed.

## 2012-02-04 DIAGNOSIS — Z0279 Encounter for issue of other medical certificate: Secondary | ICD-10-CM

## 2012-03-14 ENCOUNTER — Other Ambulatory Visit: Payer: Self-pay | Admitting: Internal Medicine

## 2012-03-14 NOTE — Telephone Encounter (Signed)
Refill done.  

## 2012-03-19 ENCOUNTER — Ambulatory Visit (INDEPENDENT_AMBULATORY_CARE_PROVIDER_SITE_OTHER): Payer: Medicare Other | Admitting: Internal Medicine

## 2012-03-19 VITALS — BP 128/74 | HR 63 | Temp 98.1°F | Wt 198.0 lb

## 2012-03-19 DIAGNOSIS — E785 Hyperlipidemia, unspecified: Secondary | ICD-10-CM

## 2012-03-19 DIAGNOSIS — G35 Multiple sclerosis: Secondary | ICD-10-CM

## 2012-03-19 DIAGNOSIS — E119 Type 2 diabetes mellitus without complications: Secondary | ICD-10-CM

## 2012-03-19 LAB — HEMOGLOBIN A1C: Hgb A1c MFr Bld: 6 % (ref 4.6–6.5)

## 2012-03-19 MED ORDER — ATORVASTATIN CALCIUM 10 MG PO TABS: 10.0000 mg | ORAL_TABLET | Freq: Every day | ORAL | Status: DC

## 2012-03-19 NOTE — Assessment & Plan Note (Signed)
Stable. Has some edema, likely from lack of mobility, recommend a low-salt diet and leg elevation. We are ordering a bone density test as well.

## 2012-03-19 NOTE — Assessment & Plan Note (Signed)
RF lipitor , well controlled

## 2012-03-19 NOTE — Assessment & Plan Note (Signed)
Due for labs

## 2012-03-19 NOTE — Progress Notes (Signed)
  Subjective:    Patient ID: Oscar Matthews, male    DOB: 05-14-1951, 62 y.o.   MRN: [id number]  HPI Routine office visit No specific concerns. A bone density test was recommended 6 months ago but has not been done. Will send a request.  Past Medical History  Diagnosis Date  . Diabetes mellitus     adult onset  . ED (erectile dysfunction)   . Dyslipidemia   . Multiple sclerosis 80s    final dx in the 90s, [facility])  . Hypogonadism male     Followup at the [place]      Review of Systems Has a history of diabetes, diet is healthy, no ambulatory blood sugars. As far as the MS, symptoms are stable, wife reports a few falls. The patient denies problems . Provigil is supposed to be taken twice a day the patient takes only once a day. Good compliance with cholesterol medication. Needs a refill.     Objective:   Physical Exam  General -- alert, well-nourished. NAD  Lungs -- normal respiratory effort, no intercostal retractions, no accessory muscle use, and normal breath sounds.   Heart-- normal rate, regular rhythm, no murmur, and no gallop.   Extremities-- symmetric +/+++  pretibial edema bilaterally     Assessment & Plan:

## 2012-03-20 ENCOUNTER — Encounter: Payer: Self-pay | Admitting: Internal Medicine

## 2012-05-14 ENCOUNTER — Telehealth: Payer: Self-pay | Admitting: Internal Medicine

## 2012-05-14 NOTE — Telephone Encounter (Signed)
Had a colonoscopy in 2009 with Dr. Arlyce Dice, was recommended to repeat a colonoscopy in 2016. No need for a referral at this point

## 2012-05-14 NOTE — Telephone Encounter (Signed)
Ok to set up referral? 

## 2012-05-14 NOTE — Telephone Encounter (Signed)
Discussed with pt's wife. 

## 2012-08-12 ENCOUNTER — Encounter: Payer: Self-pay | Admitting: Internal Medicine

## 2012-09-09 ENCOUNTER — Ambulatory Visit (INDEPENDENT_AMBULATORY_CARE_PROVIDER_SITE_OTHER)
Admission: RE | Admit: 2012-09-09 | Discharge: 2012-09-09 | Disposition: A | Discharge status: Home / Self Care | Payer: Medicare Other | Source: Ambulatory Visit | Attending: Internal Medicine | Admitting: Internal Medicine

## 2012-09-09 DIAGNOSIS — M949 Disorder of cartilage, unspecified: Secondary | ICD-10-CM

## 2012-09-09 DIAGNOSIS — G35 Multiple sclerosis: Secondary | ICD-10-CM

## 2012-09-19 ENCOUNTER — Encounter: Payer: Medicare Other | Admitting: Internal Medicine

## 2012-09-28 ENCOUNTER — Telehealth: Payer: Self-pay | Admitting: Internal Medicine

## 2012-09-28 NOTE — Telephone Encounter (Signed)
Advise patient, recent bone density test showed osteopenia, in addition to calcium and vitamin D supplements he may benefit from medication. We can discuss in detail at the time of his physical, he is actually due for a physical. Please arrange if necessary.

## 2012-09-29 ENCOUNTER — Encounter: Payer: Self-pay | Admitting: *Deleted

## 2012-09-29 NOTE — Telephone Encounter (Signed)
Letter mailed

## 2012-11-26 ENCOUNTER — Ambulatory Visit (INDEPENDENT_AMBULATORY_CARE_PROVIDER_SITE_OTHER): Payer: Medicare Other | Admitting: Internal Medicine

## 2012-11-26 ENCOUNTER — Encounter: Payer: Self-pay | Admitting: Internal Medicine

## 2012-11-26 VITALS — BP 142/82 | HR 80 | Temp 97.9°F | Ht 68.0 in | Wt 194.0 lb

## 2012-11-26 DIAGNOSIS — Z0000 Encounter for general adult medical examination without abnormal findings: Secondary | ICD-10-CM

## 2012-11-26 DIAGNOSIS — M899 Disorder of bone, unspecified: Secondary | ICD-10-CM

## 2012-11-26 DIAGNOSIS — E119 Type 2 diabetes mellitus without complications: Secondary | ICD-10-CM

## 2012-11-26 DIAGNOSIS — M8580 Other specified disorders of bone density and structure, unspecified site: Secondary | ICD-10-CM

## 2012-11-26 DIAGNOSIS — E291 Testicular hypofunction: Secondary | ICD-10-CM

## 2012-11-26 DIAGNOSIS — Z125 Encounter for screening for malignant neoplasm of prostate: Secondary | ICD-10-CM

## 2012-11-26 DIAGNOSIS — Z1321 Encounter for screening for nutritional disorder: Secondary | ICD-10-CM

## 2012-11-26 DIAGNOSIS — E785 Hyperlipidemia, unspecified: Secondary | ICD-10-CM

## 2012-11-26 HISTORY — DX: Other specified disorders of bone density and structure, unspecified site: M85.80

## 2012-11-26 LAB — COMPREHENSIVE METABOLIC PANEL
Albumin: 3.8 g/dL (ref 3.5–5.2)
CO2: 26 mEq/L (ref 19–32)
Calcium: 9.1 mg/dL (ref 8.4–10.5)
Chloride: 104 mEq/L (ref 96–112)
GFR: 123.62 mL/min (ref >60.00)
Glucose, Bld: 109 mg/dL — ABNORMAL HIGH (ref 70–99)
Sodium: 137 mEq/L (ref 135–145)
Total Bilirubin: 0.7 mg/dL (ref 0.3–1.2)
Total Protein: 7.4 g/dL (ref 6.0–8.3)

## 2012-11-26 LAB — LIPID PANEL: HDL: 43 mg/dL (ref >39.00)

## 2012-11-26 LAB — HEMOGLOBIN A1C: Hgb A1c MFr Bld: 6.1 % (ref 4.6–6.5)

## 2012-11-26 MED ORDER — ALENDRONATE SODIUM 70 MG PO TABS: 70.0000 mg | ORAL_TABLET | ORAL | Status: DC

## 2012-11-26 NOTE — Progress Notes (Signed)
Subjective:    Patient ID: Oscar Matthews, male    DOB: 1951-03-04, 61 y.o.   MRN: [id number]  HPI Here for Medicare AWV: 1. Risk factors based on Past M, S, F history: yes , risk factors reviewed 2. Physical Activities: h/o MS, less active than last year, used to [facility] (at the [facility] more often, reports fatigue, was rx a med at the [place] but is not helping  3. Depression/mood: denies problems w/ depression per se   4. Hearing: denies problems, no problems noted   5. ADL's: takes his own showers, able to dress himself,still drives occasionally  6. Fall Risk: high due to MS, no recent , see instructions 7.  Home Safety: feels safe at home   8. height, weight, &visual acuity: see VS, uses glasses, vision well corrected , blind R eye 9.         Counseling: yes, see below   10.       Labs ordered based on risk factors: yes   11.        Referral Coordination: if needed   12.        Care Plan-- see a/p   13.       Cognitive Assessment : memory and cognition seemed within normal  In addition we discussed the following: Cholesterol good medication compliance. MS, complain of fatigue, follow up at the [place], they change Provigil todextroamphetamine. Despite the medication he still feels fatigued. ED, rarely takes Viagra. Osteopenia, report reviewed, see assessment and plan  Past Medical History  Diagnosis Date  . Diabetes mellitus     adult onset  . ED (erectile dysfunction)   . Dyslipidemia   . Multiple sclerosis 80s    final dx in the 90s, [facility])  . Hypogonadism male     Followup at the [place]  . Osteopenia 11/26/2012   Past Surgical History  Procedure Date  . Umbilical hernia repair    History   Social History  . Marital Status: Married    Spouse Name: N/A    Number of Children: 1  . Years of Education: N/A   Occupational History  . Retired Sales promotion account executive    Social History Main Topics  . Smoking status: Former Games developer  . Smokeless tobacco:  Never Used     Comment: quit in the 80s  . Alcohol Use: Yes     Comment: rarely has  wine   . Drug Use: No  . Sexually Active: Not on file   Other Topics Concern  . Not on file   Social History Narrative       Family History  Problem Relation Age of Onset  . Coronary artery disease Mother   . Diabetes Father     M and F (late onset)  . Stroke Neg Hx   . Colon cancer Neg Hx   . Prostate cancer Neg Hx   . Hyperthyroidism      M  . Gout      B     Review of Systems No chest pain or shortness of breath No nausea, vomiting, diarrhea or blood in the stools. Denies any problems swallowing. Denies any bladder incontinence, no need to use catheters or depends.     Objective:   Physical Exam General -- alert,  well-nourished.   Neck --no thyromegaly , normal carotid pulse Lungs -- normal respiratory effort, no intercostal retractions, no accessory muscle use,  and normal breath sounds.   Heart-- normal rate, regular rhythm, no murmur, and no gallop.   Abdomen--soft, non-tender, no distention, no masses, no HSM, no guarding, and no rigidity.   DIABETIC FEET EXAM: No lower extremity edema Normal pedal pulses bilaterally Skin normal, nails slt thick Pinprick examination -- mild patchy decreased sensitivity. Psych-- Cognition and judgment appear intact. Alert and cooperative with normal attention span and concentration.  not anxious appearing and not depressed appearing.       Assessment & Plan:

## 2012-11-26 NOTE — Assessment & Plan Note (Addendum)
chart reviewed  Td 2008, pneumonia shot 03-2009, had a flu shot shingles shot-- reports he had that already  Cscope 09 DRE last year neg  Unable to find a "code" to get a  PSA or vit D, pt aware, rec to check at the [place] counseled about diet, exercise, fall prevention

## 2012-11-26 NOTE — Assessment & Plan Note (Signed)
Recommend to followup with the [place]

## 2012-11-26 NOTE — Patient Instructions (Addendum)
Next visit in 6-8 months  Fall Prevention and Home Safety Falls cause injuries and can affect all age groups. It is possible to use preventive measures to significantly decrease the likelihood of falls. There are many simple measures which can make your home safer and prevent falls. OUTDOORS  Repair cracks and edges of walkways and driveways.  Remove high doorway thresholds.  Trim shrubbery on the main path into your home.  Have good outside lighting.  Clear walkways of tools, rocks, debris, and clutter.  Check that handrails are not broken and are securely fastened. Both sides of steps should have handrails.  Have leaves, snow, and ice cleared regularly.  Use sand or salt on walkways during winter months.  In the garage, clean up grease or oil spills. BATHROOM  Install night lights.  Install grab bars by the toilet and in the tub and shower.  Use non-skid mats or decals in the tub or shower.  Place a plastic non-slip stool in the shower to sit on, if needed.  Keep floors dry and clean up all water on the floor immediately.  Remove soap buildup in the tub or shower on a regular basis.  Secure bath mats with non-slip, double-sided rug tape.  Remove throw rugs and tripping hazards from the floors. BEDROOMS  Install night lights.  Make sure a bedside light is easy to reach.  Do not use oversized bedding.  Keep a telephone by your bedside.  Have a firm chair with side arms to use for getting dressed.  Remove throw rugs and tripping hazards from the floor. KITCHEN  Keep handles on pots and pans turned toward the center of the stove. Use back burners when possible.  Clean up spills quickly and allow time for drying.  Avoid walking on wet floors.  Avoid hot utensils and knives.  Position shelves so they are not too high or low.  Place commonly used objects within easy reach.  If necessary, use a sturdy step stool with a grab bar when reaching.  Keep  electrical cables out of the way.  Do not use floor polish or wax that makes floors slippery. If you must use wax, use non-skid floor wax.  Remove throw rugs and tripping hazards from the floor. STAIRWAYS  Never leave objects on stairs.  Place handrails on both sides of stairways and use them. Fix any loose handrails. Make sure handrails on both sides of the stairways are as long as the stairs.  Check carpeting to make sure it is firmly attached along stairs. Make repairs to worn or loose carpet promptly.  Avoid placing throw rugs at the top or bottom of stairways, or properly secure the rug with carpet tape to prevent slippage. Get rid of throw rugs, if possible.  Have an electrician put in a light switch at the top and bottom of the stairs. OTHER FALL PREVENTION TIPS  Wear low-heel or rubber-soled shoes that are supportive and fit well. Wear closed toe shoes.  When using a stepladder, make sure it is fully opened and both spreaders are firmly locked. Do not climb a closed stepladder.  Add color or contrast paint or tape to grab bars and handrails in your home. Place contrasting color strips on first and last steps.  Learn and use mobility aids as needed. Install an electrical emergency response system.  Turn on lights to avoid dark areas. Replace light bulbs that burn out immediately. Get light switches that glow.  Arrange furniture to create clear pathways.   Keep furniture in the same place.  Firmly attach carpet with non-skid or double-sided tape.  Eliminate uneven floor surfaces.  Select a carpet pattern that does not visually hide the edge of steps.  Be aware of all pets. OTHER HOME SAFETY TIPS  Set the water temperature for 120 F (48.8 C).  Keep emergency numbers on or near the telephone.  Keep smoke detectors on every level of the home and near sleeping areas. Document Released: 11/30/2002 Document Revised: 06/10/2012 Document Reviewed: 02/29/2012 [employer]  Patient Information 2013 [employer].  

## 2012-11-26 NOTE — Assessment & Plan Note (Signed)
Reports good compliance with Lipitor, labs

## 2012-11-26 NOTE — Assessment & Plan Note (Addendum)
Bone density test ~ 08/2012 show a T score of -1.6. In addition to calcium and vitamin D. we discussed possible Fosamax. The patient reports no problems swallowing. Agreed to start fosamax, precautions discussed, rx sent

## 2012-11-26 NOTE — Assessment & Plan Note (Addendum)
Due for labs, recommend to stay as active as possible Mild neuropathy, feet care discussed

## 2013-02-07 ENCOUNTER — Other Ambulatory Visit: Payer: Self-pay

## 2013-05-11 ENCOUNTER — Telehealth: Payer: Self-pay | Admitting: Internal Medicine

## 2013-05-11 NOTE — Telephone Encounter (Signed)
Patient Information:  Caller Name: Oscar Matthews  Phone: [phone]  Patient: Oscar Matthews, Oscar Matthews  Gender: Male  DOB: 06-01-1951  Age: 62 Years  PCP: Oscar Matthews  Office Follow Up:  Does the office need to follow up with this patient?: Yes  Instructions For The Office: [name] requesting medication for Oscar Matthews for anxiety/depression- Oscar Matthews's father passed away on 2013/05/21. [facility] [place]n- [phone]   Symptoms  Reason For Call & Symptoms: Oscar Matthews's father passed away on May 22, 2023. [facility] is scheduled for 05/16/13. [name]  states Oscar Matthews is crying daily and stumbling a little.Oscar Matthews concerned he could have an exacerbation of Multiple Sclerosis if he stays upset. Oscar Matthews asking if a medication for anxiety / depression can be called in to pharmacy to "help him get through these days. Uses [facility] in [place]-[phone]  Reviewed Health History In EMR: Yes  Reviewed Medications In EMR: Yes  Reviewed Allergies In EMR: Yes  Reviewed Surgeries / Procedures: Yes  Date of Onset of Symptoms: 05/07/2013  Guideline(s) Used:  Depression  Disposition Per Guideline:   Home Care  Reason For Disposition Reached:   Recent death of a loved one  Advice Given:  Reassurance:   People with depression do get through this -- even people who feel as badly as you feel now. You can be helped.  Death of a Loved One  Sadness and depression symptoms are common and normal after the death of a loved one.  Patient Will Follow Care Advice:  YES

## 2013-05-11 NOTE — Telephone Encounter (Signed)
Please advise 

## 2013-05-11 NOTE — Telephone Encounter (Signed)
I have a prescription for alprazolam ready, this is only for anxiety not depression. If he has severe depression, please arrange a office visit.

## 2013-05-12 MED ORDER — ALPRAZOLAM 0.25 MG PO TABS: 0.2500 mg | ORAL_TABLET | Freq: Three times a day (TID) | ORAL | Status: DC | PRN

## 2013-05-12 NOTE — Telephone Encounter (Signed)
Refill done.  Discussed with pt.

## 2013-06-25 ENCOUNTER — Encounter: Payer: Self-pay | Admitting: Lab

## 2013-06-29 ENCOUNTER — Encounter: Payer: Self-pay | Admitting: Internal Medicine

## 2013-06-29 ENCOUNTER — Ambulatory Visit (INDEPENDENT_AMBULATORY_CARE_PROVIDER_SITE_OTHER): Payer: Medicare Other | Admitting: Internal Medicine

## 2013-06-29 VITALS — BP 110/70 | HR 104 | Temp 98.2°F | Wt 179.4 lb

## 2013-06-29 DIAGNOSIS — M8580 Other specified disorders of bone density and structure, unspecified site: Secondary | ICD-10-CM

## 2013-06-29 DIAGNOSIS — M899 Disorder of bone, unspecified: Secondary | ICD-10-CM

## 2013-06-29 DIAGNOSIS — L989 Disorder of the skin and subcutaneous tissue, unspecified: Secondary | ICD-10-CM

## 2013-06-29 DIAGNOSIS — E119 Type 2 diabetes mellitus without complications: Secondary | ICD-10-CM

## 2013-06-29 DIAGNOSIS — Z1321 Encounter for screening for nutritional disorder: Secondary | ICD-10-CM

## 2013-06-29 DIAGNOSIS — F411 Generalized anxiety disorder: Secondary | ICD-10-CM

## 2013-06-29 LAB — HEMOGLOBIN A1C: Hgb A1c MFr Bld: 6.3 % (ref 4.6–6.5)

## 2013-06-29 NOTE — Assessment & Plan Note (Signed)
Due for labs

## 2013-06-29 NOTE — Assessment & Plan Note (Signed)
Few months history of a skin lesion as described above. Could be skin irritation from pressure as he leans to the left when he sits in a chair. Recommend observation

## 2013-06-29 NOTE — Patient Instructions (Addendum)
Next visit 6 to 7 months

## 2013-06-29 NOTE — Assessment & Plan Note (Signed)
Start Fosamax 11/2012, good compliance, no apparent side effects. Plan: Continue Fosamax, calcium and vitamin D supplementation

## 2013-06-29 NOTE — Progress Notes (Signed)
  Subjective:    Patient ID: Oscar Matthews, male    DOB: [birth year], 62 y.o.   MRN: [id number]  HPI ROV. Here with his wife. Since the last time he was here, he lost his father, took Xanax temporarily but he does not need that anymore. Osteopenia, on Fosamax, good compliance with precautions, no apparent side effects. Diabetes, last A1c very good, due for labs. Wife noted a skin lesion few months ago. See physical exam.  Past Medical History  Diagnosis Date  . Diabetes mellitus     adult onset  . ED (erectile dysfunction)   . Dyslipidemia   . Multiple sclerosis 80s    final dx in the 90s, [facility])  . Hypogonadism male     Followup at the [place]  . Osteopenia 11/26/2012   Past Surgical History  Procedure Laterality Date  . Umbilical hernia repair     History   Social History  . Marital Status: Married    Spouse Name: N/A    Number of Children: 1  . Years of Education: N/A   Occupational History  . Retired Education officer, community    Social History Main Topics  . Smoking status: Former Games developer  . Smokeless tobacco: Never Used     Comment: quit in the 80s  . Alcohol Use: Yes     Comment: rarely has  wine   . Drug Use: No  . Sexually Active: Not on file   Other Topics Concern  . Not on file   Social History Narrative  . No narrative on file     Review of Systems No chest pain or shortness or breath. No nausea, vomiting, diarrhea.    Objective:   Physical Exam  Skin:      General -- alert, NAD, Walks with difficulty .    Lungs -- normal respiratory effort, no intercostal retractions, no accessory muscle use, and normal breath sounds.   Heart-- normal rate, regular rhythm, no murmur, and no gallop.   Extremities-- no pretibial edema bilaterally  Psych-- Cognition and judgment appear intact. Alert and cooperative with normal attention span and concentration.  not anxious appearing and not depressed appearing.      Assessment & Plan:   Anxiety, Lost his 25 year old father few weeks ago, took  Xanax temporarily, patient is counseled, does not need any medication at this time.

## 2013-10-29 ENCOUNTER — Other Ambulatory Visit: Payer: Self-pay

## 2014-01-13 ENCOUNTER — Ambulatory Visit (INDEPENDENT_AMBULATORY_CARE_PROVIDER_SITE_OTHER): Payer: Medicare Other | Admitting: Internal Medicine

## 2014-01-13 ENCOUNTER — Encounter: Payer: Self-pay | Admitting: Internal Medicine

## 2014-01-13 VITALS — BP 135/71 | HR 87 | Temp 98.3°F | Wt 190.0 lb

## 2014-01-13 DIAGNOSIS — E119 Type 2 diabetes mellitus without complications: Secondary | ICD-10-CM

## 2014-01-13 DIAGNOSIS — E785 Hyperlipidemia, unspecified: Secondary | ICD-10-CM

## 2014-01-13 DIAGNOSIS — L989 Disorder of the skin and subcutaneous tissue, unspecified: Secondary | ICD-10-CM

## 2014-01-13 LAB — BASIC METABOLIC PANEL
BUN: 16 mg/dL (ref 6–23)
CHLORIDE: 104 meq/L (ref 96–112)
CO2: 26 meq/L (ref 19–32)
CREATININE: 0.8 mg/dL (ref 0.4–1.5)
Calcium: 9.3 mg/dL (ref 8.4–10.5)
GFR: 123.18 mL/min (ref >60.00)
GLUCOSE: 86 mg/dL (ref 70–99)
Potassium: 3.8 mEq/L (ref 3.5–5.1)
Sodium: 137 mEq/L (ref 135–145)

## 2014-01-13 LAB — LIPID PANEL
CHOL/HDL RATIO: 3
Cholesterol: 119 mg/dL (ref 0–200)
HDL: 44.6 mg/dL (ref >39.00)
LDL CALC: 48 mg/dL (ref 0–99)
Triglycerides: 130 mg/dL (ref 0.0–149.0)
VLDL: 26 mg/dL (ref 0.0–40.0)

## 2014-01-13 LAB — AST: AST: 24 U/L (ref 0–37)

## 2014-01-13 LAB — ALT: ALT: 29 U/L (ref 0–53)

## 2014-01-13 LAB — HEMOGLOBIN A1C: Hgb A1c MFr Bld: 6.1 % (ref 4.6–6.5)

## 2014-01-13 NOTE — Assessment & Plan Note (Signed)
unchanged

## 2014-01-13 NOTE — Patient Instructions (Signed)
Get your blood work before you leave   Next visit is for routine check up regards diabetes,  Cholesterol   in 6 months, no fasting Please make an appointment

## 2014-01-13 NOTE — Progress Notes (Signed)
Pre visit review using our clinic review tool, if applicable. No additional management support is needed unless otherwise documented below in the visit note. 

## 2014-01-13 NOTE — Assessment & Plan Note (Signed)
Labs

## 2014-01-13 NOTE — Assessment & Plan Note (Signed)
On diet control, he stopped swimming, I strongly encouraged him to go back to swim  regularly. Check the A1c, BMP LFTs. Unable to provide a urine sample for microalbumin

## 2014-01-13 NOTE — Progress Notes (Signed)
   Subjective:    Patient ID: Oscar Kern., male    DOB: 1951/12/18, 63 y.o.   MRN: [id number]  HPI Routine office visit Patient is here with his wife, in general feels well, he is taking all his medications as recommended. Last time he had some blood work at the [place] was ~ 5-6 months ago.    Past Medical History  Diagnosis Date  . Diabetes mellitus     adult onset  . ED (erectile dysfunction)   . Dyslipidemia   . Multiple sclerosis 80s    final dx in the 90s, [facility])  . Hypogonadism male     Followup at the [place]  . Osteopenia 11/26/2012   Past Surgical History  Procedure Laterality Date  . Umbilical hernia repair     History   Social History  . Marital Status: Married    Spouse Name: N/A    Number of Children: 1  . Years of Education: N/A   Occupational History  . Retired Education officer, community    Social History Main Topics  . Smoking status: Former Games developer  . Smokeless tobacco: Never Used     Comment: quit in the 80s  . Alcohol Use: Yes     Comment: rarely has  wine   . Drug Use: No  . Sexual Activity: Not on file   Other Topics Concern  . Not on file   Social History Narrative  . No narrative on file    Review of Systems Did have a flu shot. Exercise--not swimming daily, he is concerned about skin lesions that he has, see physical exam. Denies dysphagia or odynophagia. No ambulatory blood sugars No anxiety or depression.    Objective:   Physical Exam  Skin:      BP 135/71  Pulse 87  Temp(Src) 98.3 F (36.8 C)  Wt 190 lb (86.183 kg)  SpO2 99% General -- alert, well-developed.   Lungs -- normal respiratory effort, no intercostal retractions, no accessory muscle use, and normal breath sounds.  Heart-- normal rate, regular rhythm, no murmur.   Extremities-- no pretibial edema bilaterally  Neurologic--  alert & oriented X3.  Changes consistent with MS, at baseline Psych-- Cognition and judgment appear intact. Cooperative with  normal attention span and concentration. No anxious or depressed appearing.      Assessment & Plan:

## 2014-01-15 ENCOUNTER — Telehealth: Payer: Self-pay

## 2014-01-15 NOTE — Telephone Encounter (Signed)
Relevant patient education assigned to patient using Emmi. ° °

## 2014-02-04 ENCOUNTER — Telehealth: Payer: Self-pay | Admitting: *Deleted

## 2014-02-04 NOTE — Telephone Encounter (Signed)
Advised medication is not for depression but anxiety. Recommended to be seen. appt scheduled

## 2014-02-04 NOTE — Telephone Encounter (Signed)
Patient wife called and wanted to ask dr Drue Novel a question. Patient has some left over pills for alprazolam that dr Drue Novel wrote him in may. He would like to start taking them again because he is feeling down. The pills are not expired just wanted to be sure it was okay to start taking them again.

## 2014-02-10 ENCOUNTER — Ambulatory Visit (INDEPENDENT_AMBULATORY_CARE_PROVIDER_SITE_OTHER): Payer: Medicare Other | Admitting: Internal Medicine

## 2014-02-10 ENCOUNTER — Encounter: Payer: Self-pay | Admitting: Internal Medicine

## 2014-02-10 VITALS — BP 126/80 | HR 75 | Temp 97.9°F | Wt 190.0 lb

## 2014-02-10 DIAGNOSIS — F32A Depression, unspecified: Secondary | ICD-10-CM

## 2014-02-10 DIAGNOSIS — F3289 Other specified depressive episodes: Secondary | ICD-10-CM

## 2014-02-10 DIAGNOSIS — F329 Major depressive disorder, single episode, unspecified: Secondary | ICD-10-CM

## 2014-02-10 MED ORDER — ESCITALOPRAM OXALATE 10 MG PO TABS: 10.0000 mg | ORAL_TABLET | Freq: Every day | ORAL | Status: DC

## 2014-02-10 NOTE — Progress Notes (Signed)
Subjective:    Patient ID: Oscar Kernoy D Elie Jr., male    DOB: August 01, 1951, 63 y.o.   MRN: [id number]  DOS:  02/10/2014 Acute visit set up by his wife. Oscar Matthews lost  his father at age 63 few months ago, since then he's not the same, has less energy, does not feel motivated, has lost interest in doing things. Wife thinks he is depressed    Past Medical History  Diagnosis Date  . Diabetes mellitus     adult onset  . ED (erectile dysfunction)   . Dyslipidemia   . Multiple sclerosis 80s    final dx in the 90s, [facility])  . Hypogonadism male     Followup at [facility]  . Osteopenia 11/26/2012    Past Surgical History  Procedure Laterality Date  . Umbilical hernia repair      History   Social History  . Marital Status: Married    Spouse Name: N/A    Number of Children: 1  . Years of Education: N/A   Occupational History  . Retired Education officer, communitydentist    Social History Main Topics  . Smoking status: Former Games developermoker  . Smokeless tobacco: Never Used     Comment: quit in the 80s  . Alcohol Use: Yes     Comment: rarely has  wine   . Drug Use: No  . Sexual Activity: Not on file   Other Topics Concern  . Not on file   Social History Narrative  . No narrative on file    ROS  Oscar Matthews states he physically feels the same. Denies any chest pain, shortness or breath, lower extremity edema. Denies snoring Denies suicidal thoughts     Objective:   Physical Exam BP 126/80  Pulse 75  Temp(Src) 97.9 F (36.6 C)  Wt 190 lb (86.183 kg)  SpO2 99% General -- alert,  NAD, physically at baseline .   Neurologic--  alert & oriented X3.  Psych-- Cognition and judgment appear intact. Cooperative with normal attention span and concentration. No anxious , mildly  depressed appearing.      Assessment & Plan:   Today , I spent more than 25 min with the patient, >50% of the time counseling     Medication List       This list is accurate as of: 02/10/14  2:01 PM.  Always use  your most recent med list.               alendronate 70 MG tablet  Commonly known as:  FOSAMAX  Take 1 tablet (70 mg total) by mouth every 7 (seven) days. Take with a full glass of water on an empty stomach.     ascorbic acid 250 MG Chew  Commonly known as:  VITAMIN C  Chew 250 mg by mouth daily.     aspirin 81 MG tablet  Take 81 mg by mouth daily.     atorvastatin 10 MG tablet  Commonly known as:  LIPITOR  Take 1 tablet (10 mg total) by mouth daily.     CALCIUM + D PO  Take 1 tablet by mouth daily.     dextroamphetamine 10 MG tablet  Commonly known as:  DEXTROSTAT  Take 10 mg by mouth 2 (two) times daily.     escitalopram 10 MG tablet  Commonly known as:  LEXAPRO  Take 1 tablet (10 mg total) by mouth daily.     gabapentin 800 MG  tablet  Commonly known as:  NEURONTIN  Take 800 mg by mouth 4 (four) times daily.     multivitamin capsule  Take 1 capsule by mouth daily.     sildenafil 100 MG tablet  Commonly known as:  VIAGRA  Take 100 mg by mouth daily as needed.

## 2014-02-10 NOTE — Progress Notes (Signed)
Pre visit review using our clinic review tool, if applicable. No additional management support is needed unless otherwise documented below in the visit note. 

## 2014-02-10 NOTE — Patient Instructions (Signed)
Please make an appointment with one of our counselor Start Lexapro: 1/2 tablet a day for 1 week, then 1 tablet a day Come back in 6 weeks for a check up

## 2014-02-10 NOTE — Assessment & Plan Note (Addendum)
Clinical signs of depression since he lost his father several months ago. On further questioning, he is also depressed because his inability to exercise due to MS, he used to be very athletic. PHQ 9 ---- he scores 16 which is moderate to severe depression Not suicidal Patient is counseled here, we also discussed different treatment modalities including counseling, medications and exercise. I also  encouraged him to joint a support group for MS which may actually helped him significantly. Reports that in the past he tried Wellbutrin and didn't like it At the end, we agreed to start Lexapro, he will seriously think about counseling, and return in 6 weeks. Side effects discussed including nausea- suicidality discussed

## 2014-03-24 ENCOUNTER — Encounter: Payer: Self-pay | Admitting: Internal Medicine

## 2014-03-24 ENCOUNTER — Ambulatory Visit (INDEPENDENT_AMBULATORY_CARE_PROVIDER_SITE_OTHER): Payer: Medicare Other | Admitting: Internal Medicine

## 2014-03-24 VITALS — BP 108/68 | HR 88 | Temp 97.9°F | Wt 189.0 lb

## 2014-03-24 DIAGNOSIS — F3289 Other specified depressive episodes: Secondary | ICD-10-CM

## 2014-03-24 DIAGNOSIS — F32A Depression, unspecified: Secondary | ICD-10-CM

## 2014-03-24 DIAGNOSIS — F329 Major depressive disorder, single episode, unspecified: Secondary | ICD-10-CM

## 2014-03-24 DIAGNOSIS — R5383 Other fatigue: Secondary | ICD-10-CM

## 2014-03-24 DIAGNOSIS — R5381 Other malaise: Secondary | ICD-10-CM

## 2014-03-24 LAB — CBC WITH DIFFERENTIAL/PLATELET
Basophils Absolute: 0.1 10*3/uL (ref 0.0–0.1)
Basophils Relative: 1.2 % (ref 0.0–3.0)
EOS PCT: 4.5 % (ref 0.0–5.0)
Eosinophils Absolute: 0.2 10*3/uL (ref 0.0–0.7)
HCT: 42.5 % (ref 39.0–52.0)
Hemoglobin: 13.7 g/dL (ref 13.0–17.0)
LYMPHS PCT: 27.3 % (ref 12.0–46.0)
Lymphs Abs: 1.3 10*3/uL (ref 0.7–4.0)
MCHC: 32.3 g/dL (ref 30.0–36.0)
MCV: 81.9 fl (ref 78.0–100.0)
MONO ABS: 0.5 10*3/uL (ref 0.1–1.0)
Monocytes Relative: 9.5 % (ref 3.0–12.0)
NEUTROS PCT: 57.5 % (ref 43.0–77.0)
Neutro Abs: 2.8 10*3/uL (ref 1.4–7.7)
PLATELETS: 238 10*3/uL (ref 150.0–400.0)
RBC: 5.2 Mil/uL (ref 4.22–5.81)
RDW: 14.3 % (ref 11.5–14.6)
WBC: 4.9 10*3/uL (ref 4.5–10.5)

## 2014-03-24 LAB — TSH: TSH: 0.74 u[IU]/mL (ref 0.35–5.50)

## 2014-03-24 MED ORDER — SILDENAFIL CITRATE 100 MG PO TABS: 100.0000 mg | ORAL_TABLET | Freq: Every day | ORAL | Status: DC | PRN

## 2014-03-24 MED ORDER — ESCITALOPRAM OXALATE 20 MG PO TABS: 20.0000 mg | ORAL_TABLET | Freq: Every day | ORAL | Status: DC

## 2014-03-24 NOTE — Progress Notes (Signed)
Pre visit review using our clinic review tool, if applicable. No additional management support is needed unless otherwise documented below in the visit note. 

## 2014-03-24 NOTE — Patient Instructions (Addendum)
Get your blood work before you leave   Increase escitalopram to 20 mg daily  Please consider  see one of our counselors or a counselor outside our office. That is important part of the  Treatment.   Next visit is for routine check up in 6 weeks

## 2014-03-24 NOTE — Assessment & Plan Note (Signed)
Continue feeling unmotivated, fatigue. Plan to check a CBC and a TSH to rule out anemia and thyroid disease. He snores a little, consider a sleep study. Otherwise, I still think the main problem is depression. Increased Lexapro to 20 mg, strongly encouraged to see a counselor, follow up in 6 weeks

## 2014-03-24 NOTE — Progress Notes (Signed)
Subjective:    Patient ID: Oscar Kern., male    DOB: November 21, 1951, 63 y.o.   MRN: [id number]  DOS:  03/24/2014 Type of  visit: Followup from previous visit  Taking Lexapro 10 mg daily, no apparent side effects but he has not improved. His main symptom is lack of motivation, also decreased energy,  loss in interest    ROS Denies any suicidal ideas. No tearful  but wife reports short temper. No nausea, vomiting, diarrhea or change in the color of the stools. No fever chills or weight loss No dyspnea on exertion or chest pain. Snores very little  Past Medical History  Diagnosis Date  . Diabetes mellitus     adult onset  . ED (erectile dysfunction)   . Dyslipidemia   . Multiple sclerosis 80s    final dx in the 90s, [facility])  . Hypogonadism male     Followup at the [place]  . Osteopenia 11/26/2012    Past Surgical History  Procedure Laterality Date  . Umbilical hernia repair      History   Social History  . Marital Status: Married    Spouse Name: N/A    Number of Children: 1  . Years of Education: N/A   Occupational History  . Retired Education officer, community    Social History Main Topics  . Smoking status: Former Games developer  . Smokeless tobacco: Never Used     Comment: quit in the 80s  . Alcohol Use: Yes     Comment: rarely has  wine   . Drug Use: No  . Sexual Activity: Not on file   Other Topics Concern  . Not on file   Social History Narrative  . No narrative on file        Medication List       This list is accurate as of: 03/24/14  5:57 PM.  Always use your most recent med list.               alendronate 70 MG tablet  Commonly known as:  FOSAMAX  Take 1 tablet (70 mg total) by mouth every 7 (seven) days. Take with a full glass of water on an empty stomach.     ascorbic acid 250 MG Chew  Commonly known as:  VITAMIN C  Chew 250 mg by mouth daily.     aspirin 81 MG tablet  Take 81 mg by mouth daily.     atorvastatin 10 MG tablet   Commonly known as:  LIPITOR  Take 1 tablet (10 mg total) by mouth daily.     CALCIUM + D PO  Take 1 tablet by mouth daily.     dextroamphetamine 10 MG tablet  Commonly known as:  DEXTROSTAT  Take 10 mg by mouth 2 (two) times daily.     escitalopram 20 MG tablet  Commonly known as:  LEXAPRO  Take 1 tablet (20 mg total) by mouth daily.     gabapentin 800 MG tablet  Commonly known as:  NEURONTIN  Take 800 mg by mouth 4 (four) times daily.     multivitamin capsule  Take 1 capsule by mouth daily.     sildenafil 100 MG tablet  Commonly known as:  VIAGRA  Take 1 tablet (100 mg total) by mouth daily as needed.           Objective:   Physical Exam BP 108/68  Pulse 88  Temp(Src) 97.9 F (  36.6 C)  Wt 189 lb (85.73 kg)  SpO2 97%  General -- alert, well-developed, NAD.   HEENT-- Not pale.   Neurologic--  alert & oriented X3. Speech normal.    Psych-- Cognition and judgment appear intact. Cooperative with normal attention span and concentration. Despite self perceived lack of improvement, he looks slightly better emotionally.      Assessment & Plan:   Needs refill on Viagra, provided.

## 2014-05-05 ENCOUNTER — Ambulatory Visit (INDEPENDENT_AMBULATORY_CARE_PROVIDER_SITE_OTHER): Payer: Medicare Other | Admitting: Internal Medicine

## 2014-05-05 ENCOUNTER — Encounter: Payer: Self-pay | Admitting: Internal Medicine

## 2014-05-05 VITALS — BP 116/70 | HR 70 | Temp 98.2°F | Wt 189.0 lb

## 2014-05-05 DIAGNOSIS — F32A Depression, unspecified: Secondary | ICD-10-CM

## 2014-05-05 DIAGNOSIS — M8580 Other specified disorders of bone density and structure, unspecified site: Secondary | ICD-10-CM

## 2014-05-05 DIAGNOSIS — F329 Major depressive disorder, single episode, unspecified: Secondary | ICD-10-CM

## 2014-05-05 DIAGNOSIS — M899 Disorder of bone, unspecified: Secondary | ICD-10-CM

## 2014-05-05 DIAGNOSIS — F3289 Other specified depressive episodes: Secondary | ICD-10-CM

## 2014-05-05 DIAGNOSIS — M949 Disorder of cartilage, unspecified: Secondary | ICD-10-CM

## 2014-05-05 NOTE — Progress Notes (Signed)
Pre visit review using our clinic review tool, if applicable. No additional management support is needed unless otherwise documented below in the visit note. 

## 2014-05-05 NOTE — Assessment & Plan Note (Signed)
Good compliance with Lexapro 20 mg, PHQ- 9 today is 13, slightly improved from previous 16 however he reports he still has a difficult time dealing with other people. We talk about his options: Wellbutrin .Continue with same medications. See a psychiatrist. See a counselor At this point is not interested in changing anything, we agreed to continue with Lexapro, if he change his mind he will let me know.

## 2014-05-05 NOTE — Patient Instructions (Addendum)
Next visit is for a physical exam in 3 months ,  fasting Please make an appointment     

## 2014-05-05 NOTE — Progress Notes (Signed)
Subjective:    Patient ID: Oscar Kern., male    DOB: 07/01/1951, 63 y.o.   MRN: [id number]  DOS:  05/05/2014 Type of  visit:  F/u from previous visit, here w/  His wife Good compliance with Lexapro, has not noted  any changes. Has not seen a Veterinary surgeon. Wife also reports poor compliance with Fosamax   ROS No significant snoring. No suicidal ideas.   Past Medical History  Diagnosis Date  . Diabetes mellitus     adult onset  . ED (erectile dysfunction)   . Dyslipidemia   . Multiple sclerosis 80s    final dx in the 90s, [facility])  . Hypogonadism male     Followup at the [place]  . Osteopenia 11/26/2012    Past Surgical History  Procedure Laterality Date  . Umbilical hernia repair      History   Social History  . Marital Status: Married    Spouse Name: N/A    Number of Children: 1  . Years of Education: N/A   Occupational History  . Retired Education officer, community    Social History Main Topics  . Smoking status: Former Games developer  . Smokeless tobacco: Never Used     Comment: quit in the 80s  . Alcohol Use: Yes     Comment: rarely has  wine   . Drug Use: No  . Sexual Activity: Not on file   Other Topics Concern  . Not on file   Social History Narrative  . No narrative on file        Medication List       This list is accurate as of: 05/05/14  8:15 PM.  Always use your most recent med list.               alendronate 70 MG tablet  Commonly known as:  FOSAMAX  Take 1 tablet (70 mg total) by mouth every 7 (seven) days. Take with a full glass of water on an empty stomach.     ascorbic acid 250 MG Chew  Commonly known as:  VITAMIN C  Chew 250 mg by mouth daily.     aspirin 81 MG tablet  Take 81 mg by mouth daily.     atorvastatin 10 MG tablet  Commonly known as:  LIPITOR  Take 1 tablet (10 mg total) by mouth daily.     CALCIUM + D PO  Take 1 tablet by mouth daily.     dextroamphetamine 10 MG tablet  Commonly known as:   DEXTROSTAT  Take 10 mg by mouth 2 (two) times daily.     escitalopram 20 MG tablet  Commonly known as:  LEXAPRO  Take 1 tablet (20 mg total) by mouth daily.     gabapentin 800 MG tablet  Commonly known as:  NEURONTIN  Take 800 mg by mouth 4 (four) times daily.     multivitamin capsule  Take 1 capsule by mouth daily.     sildenafil 100 MG tablet  Commonly known as:  VIAGRA  Take 1 tablet (100 mg total) by mouth daily as needed.           Objective:   Physical Exam BP 116/70  Pulse 70  Temp(Src) 98.2 F (36.8 C)  Wt 189 lb (85.73 kg)  SpO2 99%  General -- alert, well-developed, NAD.   Psych-- Cognition and judgment appear intact. Cooperative with normal attention span and concentration. No anxious or  depressed appearing.         Assessment & Plan:   Today , I spent more than 15  min with the patient, >50% of the time counseling

## 2014-05-05 NOTE — Assessment & Plan Note (Signed)
Recommend to increase his compliance with Fosamax, risk of fractures discussed

## 2014-07-13 ENCOUNTER — Ambulatory Visit: Payer: Medicare Other | Admitting: Internal Medicine

## 2014-07-24 LAB — HM DIABETES EYE EXAM

## 2014-08-05 ENCOUNTER — Encounter: Payer: Self-pay | Admitting: Internal Medicine

## 2014-08-05 ENCOUNTER — Ambulatory Visit (INDEPENDENT_AMBULATORY_CARE_PROVIDER_SITE_OTHER): Payer: Medicare Other | Admitting: Internal Medicine

## 2014-08-05 VITALS — BP 132/78 | HR 86 | Temp 98.4°F | Ht 69.0 in | Wt 187.2 lb

## 2014-08-05 DIAGNOSIS — E785 Hyperlipidemia, unspecified: Secondary | ICD-10-CM

## 2014-08-05 DIAGNOSIS — F329 Major depressive disorder, single episode, unspecified: Secondary | ICD-10-CM

## 2014-08-05 DIAGNOSIS — Z23 Encounter for immunization: Secondary | ICD-10-CM

## 2014-08-05 DIAGNOSIS — F3289 Other specified depressive episodes: Secondary | ICD-10-CM

## 2014-08-05 DIAGNOSIS — M899 Disorder of bone, unspecified: Secondary | ICD-10-CM

## 2014-08-05 DIAGNOSIS — Z0000 Encounter for general adult medical examination without abnormal findings: Secondary | ICD-10-CM

## 2014-08-05 DIAGNOSIS — Z125 Encounter for screening for malignant neoplasm of prostate: Secondary | ICD-10-CM

## 2014-08-05 DIAGNOSIS — F32A Depression, unspecified: Secondary | ICD-10-CM

## 2014-08-05 DIAGNOSIS — M8580 Other specified disorders of bone density and structure, unspecified site: Secondary | ICD-10-CM

## 2014-08-05 DIAGNOSIS — E119 Type 2 diabetes mellitus without complications: Secondary | ICD-10-CM

## 2014-08-05 DIAGNOSIS — M949 Disorder of cartilage, unspecified: Secondary | ICD-10-CM

## 2014-08-05 LAB — BASIC METABOLIC PANEL
BUN: 15 mg/dL (ref 6–23)
CO2: 27 meq/L (ref 19–32)
Calcium: 9.2 mg/dL (ref 8.4–10.5)
Chloride: 104 mEq/L (ref 96–112)
Creatinine, Ser: 1 mg/dL (ref 0.4–1.5)
GFR: 99.87 mL/min (ref >60.00)
Glucose, Bld: 100 mg/dL — ABNORMAL HIGH (ref 70–99)
Potassium: 3.8 mEq/L (ref 3.5–5.1)
SODIUM: 137 meq/L (ref 135–145)

## 2014-08-05 LAB — HEMOGLOBIN A1C: Hgb A1c MFr Bld: 6.1 % (ref 4.6–6.5)

## 2014-08-05 LAB — PSA: PSA: 1.12 ng/mL (ref 0.10–4.00)

## 2014-08-05 NOTE — Assessment & Plan Note (Signed)
See previous entry, started Lexapro, neither patient nor his wife noticed any change in his mood. Recommend to discontinue if so desire

## 2014-08-05 NOTE — Assessment & Plan Note (Signed)
Due for labs

## 2014-08-05 NOTE — Assessment & Plan Note (Signed)
Well-controlled per last FLP 

## 2014-08-05 NOTE — Assessment & Plan Note (Addendum)
Not taking Fosamax every week, last bone density test was 08-2012. Plan: Continue with Fosamax weekly, calcium, vitamin D. Will check a bone density test next month (order enter)

## 2014-08-05 NOTE — Progress Notes (Signed)
Pre-visit discussion using our clinic review tool. No additional management support is needed unless otherwise documented below in the visit note.  

## 2014-08-05 NOTE — Assessment & Plan Note (Addendum)
Td 2008 pneumonia shot 03-2009,  prevnar-- today shingles shot-- reports he had that already  H/o polyps, last Cscope 09-- no polyps, was rec next cscope 2016 per report  DRE 2012 neg , last PSA 2011 normal  DRE checked again today wnl, labs   counseled about diet, exercise, fall prevention

## 2014-08-05 NOTE — Patient Instructions (Signed)
Get your blood work before you leave   Get a flu shot this year   Next visit is for routine check up in 6 months, fasting  Fall Prevention and Home Safety Falls cause injuries and can affect all age groups. It is possible to use preventive measures to significantly decrease the likelihood of falls. There are many simple measures which can make your home safer and prevent falls. OUTDOORS  Repair cracks and edges of walkways and driveways.  Remove high doorway thresholds.  Trim shrubbery on the main path into your home.  Have good outside lighting.  Clear walkways of tools, rocks, debris, and clutter.  Check that handrails are not broken and are securely fastened. Both sides of steps should have handrails.  Have leaves, snow, and ice cleared regularly.  Use sand or salt on walkways during winter months.  In the garage, clean up grease or oil spills. BATHROOM  Install night lights.  Install grab bars by the toilet and in the tub and shower.  Use non-skid mats or decals in the tub or shower.  Place a plastic non-slip stool in the shower to sit on, if needed.  Keep floors dry and clean up all water on the floor immediately.  Remove soap buildup in the tub or shower on a regular basis.  Secure bath mats with non-slip, double-sided rug tape.  Remove throw rugs and tripping hazards from the floors. BEDROOMS  Install night lights.  Make sure a bedside light is easy to reach.  Do not use oversized bedding.  Keep a telephone by your bedside.  Have a firm chair with side arms to use for getting dressed.  Remove throw rugs and tripping hazards from the floor. KITCHEN  Keep handles on pots and pans turned toward the center of the stove. Use back burners when possible.  Clean up spills quickly and allow time for drying.  Avoid walking on wet floors.  Avoid hot utensils and knives.  Position shelves so they are not too high or low.  Place commonly used objects  within easy reach.  If necessary, use a sturdy step stool with a grab bar when reaching.  Keep electrical cables out of the way.  Do not use floor polish or wax that makes floors slippery. If you must use wax, use non-skid floor wax.  Remove throw rugs and tripping hazards from the floor. STAIRWAYS  Never leave objects on stairs.  Place handrails on both sides of stairways and use them. Fix any loose handrails. Make sure handrails on both sides of the stairways are as long as the stairs.  Check carpeting to make sure it is firmly attached along stairs. Make repairs to worn or loose carpet promptly.  Avoid placing throw rugs at the top or bottom of stairways, or properly secure the rug with carpet tape to prevent slippage. Get rid of throw rugs, if possible.  Have an electrician put in a light switch at the top and bottom of the stairs. OTHER FALL PREVENTION TIPS  Wear low-heel or rubber-soled shoes that are supportive and fit well. Wear closed toe shoes.  When using a stepladder, make sure it is fully opened and both spreaders are firmly locked. Do not climb a closed stepladder.  Add color or contrast paint or tape to grab bars and handrails in your home. Place contrasting color strips on first and last steps.  Learn and use mobility aids as needed. Install an electrical emergency response system.  Turn on lights  to avoid dark areas. Replace light bulbs that burn out immediately. Get light switches that glow.  Arrange furniture to create clear pathways. Keep furniture in the same place.  Firmly attach carpet with non-skid or double-sided tape.  Eliminate uneven floor surfaces.  Select a carpet pattern that does not visually hide the edge of steps.  Be aware of all pets. OTHER HOME SAFETY TIPS  Set the water temperature for 120 F (48.8 C).  Keep emergency numbers on or near the telephone.  Keep smoke detectors on every level of the home and near sleeping  areas. Document Released: 11/30/2002 Document Revised: 06/10/2012 Document Reviewed: 02/29/2012 [facility] Patient Information 2015 [place], [place]. This information is not intended to replace advice given to you by your health care provider. Make sure you discuss any questions you have with your health care provider.

## 2014-08-05 NOTE — Progress Notes (Signed)
Subjective:    Patient ID: Oscar Matthews., male    DOB: 1951-11-16, 63 y.o.   MRN: [id number]  DOS:  08/05/2014 Type of visit - description:  Here for Medicare AWV: 1. Risk factors based on Past M, S, F history: yes , risk factors reviewed 2. Physical Activities: h/o MS, less active than before  3. Depression/mood: denies problems w/ depression per se   4. Hearing: denies problems, no problems noted   5. ADL's:  takes his own showers, able to dress himself,  Drives rarely   6. Fall Risk: had 2 falls, high , he is high risk, declined PT or a walker, see instructions 7.  Home Safety: feels safe at home   8. 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  Counseling: yes, see below   10.   Labs ordered based on risk factors: yes   11. Referral Coordination: if needed   12.   Care Plan-- see a/p   13.  Cognitive Assessment : memory and cognition seemed within normal  In addition we discussed the following: Med compliance, wife reports that he often times refuses/does not take his medications. Osteoporosis, on Fosamax. High cholesterol, on Lipitor, no apparent side effects Depression? Started escitalopram,   the patient and wife noted no difference. DM-- no amb CBGs  ROS  No  CP, SOB Denies  nausea, vomiting diarrhea, blood in the stools No abdominal pain (-) cough, sputum production (-) wheezing, chest congestion  No dysuria, gross hematuria, difficulty urinating ; occ incontinence  No headaches. Denies dizziness   Past Medical History  Diagnosis Date  . Diabetes mellitus     adult onset  . ED (erectile dysfunction)   . Dyslipidemia   . Multiple sclerosis 80s    final dx in the 90s, [facility])  . Hypogonadism male     Followup at the [place]  . Osteopenia 11/26/2012    Past Surgical History  Procedure Laterality Date  . Umbilical hernia repair      History   Social History  . Marital  Status: Married    Spouse Name: N/A    Number of Children: 1  . Years of Education: N/A   Occupational History  . Retired Education officer, community    Social History Main Topics  . Smoking status: Former Games developer  . Smokeless tobacco: Never Used     Comment: quit in the 80s  . Alcohol Use: Yes     Comment: rarely has  wine   . Drug Use: No  . Sexual Activity: Not on file   Other Topics Concern  . Not on file   Social History Narrative  . No narrative on file     Family History  Problem Relation Age of Onset  . Coronary artery disease Mother     age of onset?  . Diabetes Father     M and F (late onset)  . Stroke Neg Hx   . Colon cancer Neg Hx   . Prostate cancer Neg Hx   . Hyperthyroidism Mother   . Gout Brother        Medication List       This list is accurate as of: 08/05/14  2:45 PM.  Always use your most recent med list.               alendronate 70 MG tablet  Commonly  known as:  FOSAMAX  Take 1 tablet (70 mg total) by mouth every 7 (seven) days. Take with a full glass of water on an empty stomach.     ascorbic acid 250 MG Chew  Commonly known as:  VITAMIN C  Chew 250 mg by mouth daily.     aspirin 81 MG tablet  Take 81 mg by mouth daily.     atorvastatin 10 MG tablet  Commonly known as:  LIPITOR  Take 1 tablet (10 mg total) by mouth daily.     CALCIUM + D PO  Take 1 tablet by mouth daily.     dextroamphetamine 10 MG tablet  Commonly known as:  DEXTROSTAT  Take 10 mg by mouth 2 (two) times daily.     escitalopram 20 MG tablet  Commonly known as:  LEXAPRO  Take 1 tablet (20 mg total) by mouth daily.     gabapentin 800 MG tablet  Commonly known as:  NEURONTIN  Take 800 mg by mouth 4 (four) times daily.     multivitamin capsule  Take 1 capsule by mouth daily.     sildenafil 100 MG tablet  Commonly known as:  VIAGRA  Take 1 tablet (100 mg total) by mouth daily as needed.           Objective:   Physical Exam BP 132/78  Pulse 86  Temp(Src) 98.4 F  (36.9 C) (Oral)  Ht 5\' 9"  (1.753 m)  Wt 187 lb 4 oz (84.936 kg)  BMI 27.64 kg/m2  SpO2 100% General -- alert, well-developed, NAD.  Neck --no thyromegaly  HEENT-- Not pale. Lungs -- normal respiratory effort, no intercostal retractions, no accessory muscle use, and normal breath sounds.  Heart-- normal rate, regular rhythm, no murmur.  Abdomen-- Not distended, good bowel sounds,soft, non-tender. Rectal-- No external abnormalities noted. Normal sphincter tone. No rectal masses or tenderness. Stool brown   Prostate--Prostate gland firm and smooth, no enlargement, nodularity, tenderness, mass, asymmetry or induration. Extremities-- no pretibial edema bilaterally  Neurologic--  alert & oriented X3.   Psych-- Cognition and judgment appear intact. Cooperative with normal attention span and concentration. No anxious or depressed appearing.        Assessment & Plan:

## 2014-08-16 ENCOUNTER — Inpatient Hospital Stay: Admission: RE | Admit: 2014-08-16 | Payer: Medicare Other | Source: Ambulatory Visit

## 2014-09-18 ENCOUNTER — Other Ambulatory Visit: Payer: Self-pay | Admitting: Internal Medicine

## 2014-10-07 ENCOUNTER — Telehealth: Payer: Self-pay

## 2014-10-07 NOTE — Telephone Encounter (Signed)
Pt states that he had an eye exam 1 month ago at the [facility] at [facility] in [facility], however the exam was not completed and therefore has to go back.  He plans to schedule an appointment soon.

## 2014-10-08 ENCOUNTER — Other Ambulatory Visit: Payer: Self-pay

## 2015-02-07 ENCOUNTER — Ambulatory Visit: Payer: Medicare Other | Admitting: Internal Medicine

## 2015-02-22 ENCOUNTER — Ambulatory Visit (INDEPENDENT_AMBULATORY_CARE_PROVIDER_SITE_OTHER): Payer: Medicare Other | Admitting: Internal Medicine

## 2015-02-22 ENCOUNTER — Encounter: Payer: Self-pay | Admitting: Internal Medicine

## 2015-02-22 VITALS — BP 120/64 | HR 72 | Temp 98.0°F | Resp 14 | Ht 69.0 in | Wt 204.1 lb

## 2015-02-22 DIAGNOSIS — F329 Major depressive disorder, single episode, unspecified: Secondary | ICD-10-CM

## 2015-02-22 DIAGNOSIS — M8580 Other specified disorders of bone density and structure, unspecified site: Secondary | ICD-10-CM

## 2015-02-22 DIAGNOSIS — Z9119 Patient's noncompliance with other medical treatment and regimen: Secondary | ICD-10-CM

## 2015-02-22 DIAGNOSIS — E785 Hyperlipidemia, unspecified: Secondary | ICD-10-CM

## 2015-02-22 DIAGNOSIS — Z91199 Patient's noncompliance with other medical treatment and regimen due to unspecified reason: Secondary | ICD-10-CM | POA: Insufficient documentation

## 2015-02-22 DIAGNOSIS — F32A Depression, unspecified: Secondary | ICD-10-CM

## 2015-02-22 LAB — LIPID PANEL
CHOLESTEROL: 170 mg/dL (ref 0–200)
HDL: 46.8 mg/dL (ref >39.00)
LDL CALC: 102 mg/dL — AB (ref 0–99)
NonHDL: 123.2
TRIGLYCERIDES: 108 mg/dL (ref 0.0–149.0)
Total CHOL/HDL Ratio: 4
VLDL: 21.6 mg/dL (ref 0.0–40.0)

## 2015-02-22 LAB — ALT: ALT: 35 U/L (ref 0–53)

## 2015-02-22 LAB — AST: AST: 27 U/L (ref 0–37)

## 2015-02-22 NOTE — Assessment & Plan Note (Signed)
Takes Lipitor inconsistently, check labs

## 2015-02-22 NOTE — Assessment & Plan Note (Signed)
Patient not taking any medications consistently despite the assistance from his wife. I asked him why and he just couldn't tell me any reason. He denies side effects as a cause of poor compliance. I explained the benefit of prescribed medications, encourage compliance

## 2015-02-22 NOTE — Assessment & Plan Note (Signed)
Since the last visit, bone density test was not done due to - according to the patient - an  insurance issue. Encourage to take Fosamax and calcium/vitamin D consistently

## 2015-02-22 NOTE — Progress Notes (Signed)
Subjective:    Patient ID: Oscar Matthews., male    DOB: 1951/03/05, 64 y.o.   MRN: [id number]  DOS:  02/22/2015 Type of visit - description : rov, here with his wife Interval history: Osteopenia, now taking Fosamax lately Anxiety depression? Hardly ever takes Lexapro Hyperlipidemia, chart reviewed, due for labs, not taking medications consistently.   Review of Systems Denies chest pain or difficulty breathing No nausea, vomiting, diarrhea  Past Medical History  Diagnosis Date  . Diabetes mellitus     adult onset  . ED (erectile dysfunction)   . Dyslipidemia   . Multiple sclerosis 80s    final dx in the 90s, [facility])  . Hypogonadism male     Followup at the [place]  . Osteopenia 11/26/2012    Past Surgical History  Procedure Laterality Date  . Umbilical hernia repair      History   Social History  . Marital Status: Married    Spouse Name: N/A  . Number of Children: 1  . Years of Education: N/A   Occupational History  . Retired Education officer, community    Social History Main Topics  . Smoking status: Former Games developer  . Smokeless tobacco: Never Used     Comment: quit in the 80s  . Alcohol Use: Yes     Comment: rarely has  wine   . Drug Use: No  . Sexual Activity: Not on file   Other Topics Concern  . Not on file   Social History Narrative        Medication List       This list is accurate as of: 02/22/15  5:48 PM.  Always use your most recent med list.               alendronate 70 MG tablet  Commonly known as:  FOSAMAX  Take 1 tablet (70 mg total) by mouth every 7 (seven) days. Take with a full glass of water on an empty stomach.     ascorbic acid 250 MG Chew  Commonly known as:  VITAMIN C  Chew 250 mg by mouth daily.     aspirin 81 MG tablet  Take 81 mg by mouth daily.     atorvastatin 10 MG tablet  Commonly known as:  LIPITOR  Take 1 tablet (10 mg total) by mouth daily.     CALCIUM + D PO  Take 1 tablet by mouth daily.     dextroamphetamine 10 MG tablet  Commonly known as:  DEXTROSTAT  Take 10 mg by mouth 2 (two) times daily.     escitalopram 20 MG tablet  Commonly known as:  LEXAPRO  TAKE 1 TABLET BY MOUTH DAILY     gabapentin 800 MG tablet  Commonly known as:  NEURONTIN  Take 800 mg by mouth 4 (four) times daily.     multivitamin capsule  Take 1 capsule by mouth daily.     sildenafil 100 MG tablet  Commonly known as:  VIAGRA  Take 1 tablet (100 mg total) by mouth daily as needed.           Objective:   Physical Exam BP 120/64 mmHg  Pulse 72  Temp(Src) 98 F (36.7 C) (Oral)  Resp 14  Ht  (1.753 m)  Wt 204 lb 2 oz (92.59 kg)  BMI 30.13 kg/m2 General:   Well developed, well nourished . NAD.  HEENT:  Normocephalic  Lungs:  CTA B  Normal respiratory effort, no intercostal retractions, no accessory muscle use. Heart: RRR,  no murmur.  Muscle skeletal: +/+++ pretibial edema bilaterally  Skin: Not pale. Not jaundice Neurologic:  alert & oriented X3.  Speech at baseline and essentially normal, gait  assisted Psych--  Cognition and judgment appear intact.  Cooperative with normal attention span and concentration.  Behavior appropriate. No anxious or depressed appearing.        Assessment & Plan:

## 2015-02-22 NOTE — Patient Instructions (Signed)
Get your blood work before you leave     Please go to the front and schedule a visit  In 6 months  for a physical exam     No  fasting

## 2015-02-22 NOTE — Progress Notes (Signed)
Pre visit review using our clinic review tool, if applicable. No additional management support is needed unless otherwise documented below in the visit note. 

## 2015-02-22 NOTE — Assessment & Plan Note (Signed)
Takes Lexapro inconsistently

## 2015-03-04 ENCOUNTER — Encounter: Payer: Self-pay | Admitting: Gastroenterology

## 2015-05-27 ENCOUNTER — Encounter: Payer: Self-pay | Admitting: Gastroenterology

## 2015-06-16 LAB — HM COLONOSCOPY

## 2015-06-20 ENCOUNTER — Other Ambulatory Visit: Payer: Self-pay

## 2015-06-30 ENCOUNTER — Ambulatory Visit (INDEPENDENT_AMBULATORY_CARE_PROVIDER_SITE_OTHER): Payer: Medicare Other | Admitting: Internal Medicine

## 2015-06-30 ENCOUNTER — Encounter: Payer: Self-pay | Admitting: Internal Medicine

## 2015-06-30 VITALS — BP 112/74 | HR 61 | Temp 98.4°F | Ht 69.0 in | Wt 192.5 lb

## 2015-06-30 DIAGNOSIS — R609 Edema, unspecified: Secondary | ICD-10-CM

## 2015-06-30 LAB — HM DIABETES FOOT EXAM

## 2015-06-30 MED ORDER — CEPHALEXIN 500 MG PO CAPS: 500.0000 mg | ORAL_CAPSULE | Freq: Four times a day (QID) | ORAL | Status: DC

## 2015-06-30 MED ORDER — ESCITALOPRAM OXALATE 20 MG PO TABS: 20.0000 mg | ORAL_TABLET | Freq: Every day | ORAL | Status: DC

## 2015-06-30 NOTE — Patient Instructions (Addendum)
Start taking Keflex, an antibiotic  Rest with legs elevated  We'll schedule ultrasound  Please come back in 2 weeks for a recheck  Call if swelling gets worse

## 2015-06-30 NOTE — Progress Notes (Signed)
Pre visit review using our clinic review tool, if applicable. No additional management support is needed unless otherwise documented below in the visit note. 

## 2015-06-30 NOTE — Progress Notes (Signed)
Subjective:    Patient ID: Oscar Kern., male    DOB: 1951/03/12, 64 y.o.   MRN: [id number]  DOS:  06/30/2015 Type of visit - description : Acute, here with his wife Interval history: Symptoms started a few weeks ago with bilateral leg swelling. No actual pain. No change in his diet, not excessive salt intake. No new medications, denies taking NSAIDs. He is quite inactive at that has not changed lately.    Review of Systems No fever chills No chest pain, difficulty breathing or palpitations. Orthopnea? due to MS, he sleeps in a recliner almost seated . That has not changed. No recent cough.  Past Medical History  Diagnosis Date  . Diabetes mellitus     adult onset  . ED (erectile dysfunction)   . Dyslipidemia   . Multiple sclerosis 80s    final dx in the 90s, [facility])  . Hypogonadism male     Followup at the [place]  . Osteopenia 11/26/2012    Past Surgical History  Procedure Laterality Date  . Umbilical hernia repair      History   Social History  . Marital Status: Married    Spouse Name: N/A  . Number of Children: 1  . Years of Education: N/A   Occupational History  . Retired Education officer, community    Social History Main Topics  . Smoking status: Former Games developer  . Smokeless tobacco: Never Used     Comment: quit in the 80s  . Alcohol Use: Yes     Comment: rarely has  wine   . Drug Use: No  . Sexual Activity: Not on file   Other Topics Concern  . Not on file   Social History Narrative        Medication List       This list is accurate as of: 06/30/15  6:00 PM.  Always use your most recent med list.               alendronate 70 MG tablet  Commonly known as:  FOSAMAX  Take 1 tablet (70 mg total) by mouth every 7 (seven) days. Take with a full glass of water on an empty stomach.     ascorbic acid 250 MG Chew  Commonly known as:  VITAMIN C  Chew 250 mg by mouth daily.     aspirin 81 MG tablet  Take 81 mg by mouth daily.     atorvastatin 10 MG tablet  Commonly known as:  LIPITOR  Take 1 tablet (10 mg total) by mouth daily.     CALCIUM + D PO  Take 1 tablet by mouth daily.     cephALEXin 500 MG capsule  Commonly known as:  KEFLEX  Take 1 capsule (500 mg total) by mouth 4 (four) times daily.     dextroamphetamine 10 MG tablet  Commonly known as:  DEXTROSTAT  Take 10 mg by mouth 2 (two) times daily.     escitalopram 20 MG tablet  Commonly known as:  LEXAPRO  Take 1 tablet (20 mg total) by mouth daily.     gabapentin 800 MG tablet  Commonly known as:  NEURONTIN  Take 800 mg by mouth 4 (four) times daily.     multivitamin capsule  Take 1 capsule by mouth daily.     sildenafil 100 MG tablet  Commonly known as:  VIAGRA  Take 1 tablet (100 mg total) by mouth daily as needed.  Objective:   Physical Exam  Skin:      BP 112/74 mmHg  Pulse 61  Temp(Src) 98.4 F (36.9 C) (Oral)  Ht 5\' 9"  (1.753 m)  Wt 192 lb 8 oz (87.317 kg)  BMI 28.41 kg/m2  SpO2 98% General:   Well nourished . NAD.  HEENT:  Normocephalic . Face symmetric, atraumatic Lungs:  CTA B Normal respiratory effort, no intercostal retractions, no accessory muscle use. Heart: RRR,  no murmur.  Lower extremities: +/+++ Pretibial edema, calves symmetric, the edema at the L leg  seems more "tense". He has a excoriation at the pretibial area. No discharge, mild redness, no warmness. Normal pedal pulses  Skin: Not pale. Not jaundice Neurologic:  alert & oriented X3.  Speech normal, gait  assisted, at baseline Psych--  Cognition and judgment appear intact.  Cooperative with normal attention span and concentration.  Behavior appropriate. No anxious or depressed appearing.       Assessment & Plan:   Edema, Patient here because edema at both legs, on exam there is some bilateral edema but compared to my previous note there is no major difference. He has not gained weight, in fact he has lost weight. The patient  wife is concerned about CHF due to family history, EKG today is normal sinus rhythm, no acute changes, no previous EKG. The left leg edema seems more "tense", question also of early cellulitis. Plan: keflex, ultrasound left leg. Leg elevation. Reassess in 2 weeks

## 2015-07-13 ENCOUNTER — Ambulatory Visit (INDEPENDENT_AMBULATORY_CARE_PROVIDER_SITE_OTHER): Payer: Medicare Other | Admitting: Internal Medicine

## 2015-07-13 ENCOUNTER — Ambulatory Visit (HOSPITAL_BASED_OUTPATIENT_CLINIC_OR_DEPARTMENT_OTHER)
Admission: RE | Admit: 2015-07-13 | Discharge: 2015-07-13 | Disposition: A | Discharge status: Home / Self Care | Payer: Medicare Other | Source: Ambulatory Visit | Attending: Internal Medicine | Admitting: Internal Medicine

## 2015-07-13 ENCOUNTER — Encounter: Payer: Self-pay | Admitting: Internal Medicine

## 2015-07-13 VITALS — BP 118/76 | HR 66 | Temp 98.1°F | Ht 69.0 in | Wt 195.2 lb

## 2015-07-13 DIAGNOSIS — R609 Edema, unspecified: Secondary | ICD-10-CM | POA: Diagnosis not present

## 2015-07-13 DIAGNOSIS — R600 Localized edema: Secondary | ICD-10-CM | POA: Diagnosis present

## 2015-07-13 LAB — BASIC METABOLIC PANEL
BUN: 17 mg/dL (ref 6–23)
CALCIUM: 9.1 mg/dL (ref 8.4–10.5)
CO2: 26 mEq/L (ref 19–32)
CREATININE: 0.82 mg/dL (ref 0.40–1.50)
Chloride: 105 mEq/L (ref 96–112)
GFR: 120.88 mL/min (ref >60.00)
Glucose, Bld: 103 mg/dL — ABNORMAL HIGH (ref 70–99)
Potassium: 3.7 mEq/L (ref 3.5–5.1)
SODIUM: 137 meq/L (ref 135–145)

## 2015-07-13 LAB — CBC WITH DIFFERENTIAL/PLATELET
BASOS PCT: 0.7 % (ref 0.0–3.0)
Basophils Absolute: 0 10*3/uL (ref 0.0–0.1)
Eosinophils Absolute: 0.2 10*3/uL (ref 0.0–0.7)
Eosinophils Relative: 5 % (ref 0.0–5.0)
HCT: 41.3 % (ref 39.0–52.0)
Hemoglobin: 13.5 g/dL (ref 13.0–17.0)
LYMPHS PCT: 29.2 % (ref 12.0–46.0)
Lymphs Abs: 1.3 10*3/uL (ref 0.7–4.0)
MCHC: 32.8 g/dL (ref 30.0–36.0)
MCV: 79.3 fl (ref 78.0–100.0)
MONO ABS: 0.4 10*3/uL (ref 0.1–1.0)
MONOS PCT: 9.3 % (ref 3.0–12.0)
NEUTROS ABS: 2.6 10*3/uL (ref 1.4–7.7)
Neutrophils Relative %: 55.8 % (ref 43.0–77.0)
PLATELETS: 237 10*3/uL (ref 150.0–400.0)
RBC: 5.21 Mil/uL (ref 4.22–5.81)
RDW: 14.6 % (ref 11.5–15.5)
WBC: 4.6 10*3/uL (ref 4.0–10.5)

## 2015-07-13 MED ORDER — DOXYCYCLINE HYCLATE 100 MG PO TBEC: 100.0000 mg | DELAYED_RELEASE_TABLET | Freq: Two times a day (BID) | ORAL | Status: DC

## 2015-07-13 MED ORDER — POTASSIUM CHLORIDE CRYS ER 10 MEQ PO TBCR: 10.0000 meq | EXTENDED_RELEASE_TABLET | Freq: Every day | ORAL | Status: DC

## 2015-07-13 MED ORDER — FUROSEMIDE 20 MG PO TABS: 20.0000 mg | ORAL_TABLET | Freq: Every day | ORAL | Status: DC

## 2015-07-13 NOTE — Patient Instructions (Signed)
Get your blood work before you leave   Come back to the first floor at 4:15 PM today to get the ultrasound done  Take the antibiotic call doxycycline for one week  For  swelling: keep your legs elevated Take 1 tablet of Lasix and one of potassium in the morning for one week, then 1 time a day as needed for swelling

## 2015-07-13 NOTE — Assessment & Plan Note (Signed)
Lower extremity edema, cellulitis. Plan: Lasix, KCl Ultrasound left leg, labs  abx See instructions

## 2015-07-13 NOTE — Progress Notes (Signed)
Pre visit review using our clinic review tool, if applicable. No additional management support is needed unless otherwise documented below in the visit note. 

## 2015-07-13 NOTE — Progress Notes (Signed)
Subjective:    Patient ID: Oscar Kern., male    DOB: 11-Apr-1951, 64 y.o.   MRN: [id number]  DOS:  07/13/2015 Type of visit - description : follow-up from previous visit, was seen with edema and infection Interval history: The patient was prescribed Keflex, he took it, no apparent side effects. Was recommended ultrasound but did not follow through. Edema is about the same   Review of Systems denies fever chills. No nausea, vomiting or diarrhea  Past Medical History  Diagnosis Date  . Diabetes mellitus     adult onset  . ED (erectile dysfunction)   . Dyslipidemia   . Multiple sclerosis 80s    final dx in the 90s, [facility])  . Hypogonadism male     Followup at the [place]  . Osteopenia 11/26/2012    Past Surgical History  Procedure Laterality Date  . Umbilical hernia repair      History   Social History  . Marital Status: Married    Spouse Name: N/A  . Number of Children: 1  . Years of Education: N/A   Occupational History  . Retired Education officer, community    Social History Main Topics  . Smoking status: Former Games developer  . Smokeless tobacco: Never Used     Comment: quit in the 80s  . Alcohol Use: Yes     Comment: rarely has  wine   . Drug Use: No  . Sexual Activity: Not on file   Other Topics Concern  . Not on file   Social History Narrative        Medication List       This list is accurate as of: 07/13/15  5:30 PM.  Always use your most recent med list.               alendronate 70 MG tablet  Commonly known as:  FOSAMAX  Take 1 tablet (70 mg total) by mouth every 7 (seven) days. Take with a full glass of water on an empty stomach.     ascorbic acid 250 MG Chew  Commonly known as:  VITAMIN C  Chew 250 mg by mouth daily.     aspirin 81 MG tablet  Take 81 mg by mouth daily.     atorvastatin 10 MG tablet  Commonly known as:  LIPITOR  Take 1 tablet (10 mg total) by mouth daily.     CALCIUM + D PO  Take 1 tablet by mouth  daily.     dextroamphetamine 10 MG tablet  Commonly known as:  DEXTROSTAT  Take 10 mg by mouth 2 (two) times daily.     doxycycline 100 MG EC tablet  Commonly known as:  DORYX  Take 1 tablet (100 mg total) by mouth 2 (two) times daily.     escitalopram 20 MG tablet  Commonly known as:  LEXAPRO  Take 1 tablet (20 mg total) by mouth daily.     furosemide 20 MG tablet  Commonly known as:  LASIX  Take 1 tablet (20 mg total) by mouth daily.     gabapentin 800 MG tablet  Commonly known as:  NEURONTIN  Take 800 mg by mouth 4 (four) times daily.     multivitamin capsule  Take 1 capsule by mouth daily.     potassium chloride 10 MEQ tablet  Commonly known as:  K-DUR,KLOR-CON  Take 1 tablet (10 mEq total) by mouth daily.     sildenafil  100 MG tablet  Commonly known as:  VIAGRA  Take 1 tablet (100 mg total) by mouth daily as needed.           Objective:   Physical Exam  Skin:      BP 118/76 mmHg  Pulse 66  Temp(Src) 98.1 F (36.7 C) (Oral)  Ht 5\' 9"  (1.753 m)  Wt 195 lb 4 oz (88.565 kg)  BMI 28.82 kg/m2  SpO2 97%    General:   Well developed, well nourished . NAD.  HEENT:  Normocephalic . Face symmetric, atraumatic Legs: Skin around the legs (see graphic) is a slightly warm,indurated, hyperpigmented,not tender. At the left pretibial area  still has a excoriation with a small amount of clear fluid oozing. Calves are nearly symmetric  ( actually left one is 1 cm smaller in circumference ), but again the left calf seems more "tense" Neurologic:  alert & oriented X3.  Psych--  Cognition and judgment appear intact.  Cooperative with normal attention span and concentration.  Behavior appropriate. No anxious or depressed appearing.   Assessment & Plan:

## 2015-07-18 ENCOUNTER — Telehealth: Payer: Self-pay | Admitting: *Deleted

## 2015-07-18 NOTE — Telephone Encounter (Signed)
PA for doxycycline initiated. Awaiting determination. JG//CMA

## 2015-07-19 NOTE — Telephone Encounter (Signed)
No PA required according to insurance. JG//CMA

## 2015-08-03 ENCOUNTER — Ambulatory Visit (INDEPENDENT_AMBULATORY_CARE_PROVIDER_SITE_OTHER): Payer: Medicare Other | Admitting: Internal Medicine

## 2015-08-03 ENCOUNTER — Encounter: Payer: Self-pay | Admitting: Internal Medicine

## 2015-08-03 VITALS — BP 116/76 | HR 76 | Temp 98.2°F | Ht 69.0 in | Wt 198.2 lb

## 2015-08-03 DIAGNOSIS — R609 Edema, unspecified: Secondary | ICD-10-CM

## 2015-08-03 MED ORDER — POTASSIUM CHLORIDE CRYS ER 10 MEQ PO TBCR: 20.0000 meq | EXTENDED_RELEASE_TABLET | Freq: Every day | ORAL | Status: DC

## 2015-08-03 MED ORDER — FUROSEMIDE 20 MG PO TABS: 40.0000 mg | ORAL_TABLET | Freq: Every day | ORAL | Status: DC

## 2015-08-03 NOTE — Progress Notes (Signed)
Subjective:    Patient ID: Oscar Matthews., male    DOB: Jan 02, 1951, 64 y.o.   MRN: [id number]  DOS:  08/03/2015 Type of visit - description : Follow-up, here with his wife Interval history: Since the last office visit, he took doxycycline, had an ultrasound which was negative for DVT. He is taking Lasix and potassium supplements without apparent problems. Edema is slightly better but not completely well.  Wt Readings from Last 3 Encounters:  08/03/15 198 lb 4 oz (89.926 kg)  07/13/15 195 lb 4 oz (88.565 kg)  06/30/15 192 lb 8 oz (87.317 kg)      Review of Systems  Denies any fever chills No purulent discharge, still oozing some clear fluid  Past Medical History  Diagnosis Date  . Diabetes mellitus     adult onset  . ED (erectile dysfunction)   . Dyslipidemia   . Multiple sclerosis 80s    final dx in the 90s, [facility])  . Hypogonadism male     Followup at the [place]  . Osteopenia 11/26/2012    Past Surgical History  Procedure Laterality Date  . Umbilical hernia repair      Social History   Social History  . Marital Status: Married    Spouse Name: N/A  . Number of Children: 1  . Years of Education: N/A   Occupational History  . Retired Education officer, community    Social History Main Topics  . Smoking status: Former Games developer  . Smokeless tobacco: Never Used     Comment: quit in the 80s  . Alcohol Use: Yes     Comment: rarely has  wine   . Drug Use: No  . Sexual Activity: Not on file   Other Topics Concern  . Not on file   Social History Narrative        Medication List       This list is accurate as of: 08/03/15  6:25 PM.  Always use your most recent med list.               alendronate 70 MG tablet  Commonly known as:  FOSAMAX  Take 1 tablet (70 mg total) by mouth every 7 (seven) days. Take with a full glass of water on an empty stomach.     ascorbic acid 250 MG Chew  Commonly known as:  VITAMIN C  Chew 250 mg by mouth daily.       aspirin 81 MG tablet  Take 81 mg by mouth daily.     atorvastatin 10 MG tablet  Commonly known as:  LIPITOR  Take 1 tablet (10 mg total) by mouth daily.     CALCIUM + D PO  Take 1 tablet by mouth daily.     dextroamphetamine 10 MG tablet  Commonly known as:  DEXTROSTAT  Take 10 mg by mouth 2 (two) times daily.     escitalopram 20 MG tablet  Commonly known as:  LEXAPRO  Take 1 tablet (20 mg total) by mouth daily.     furosemide 20 MG tablet  Commonly known as:  LASIX  Take 2 tablets (40 mg total) by mouth daily.     gabapentin 800 MG tablet  Commonly known as:  NEURONTIN  Take 800 mg by mouth 4 (four) times daily.     multivitamin capsule  Take 1 capsule by mouth daily.     potassium chloride 10 MEQ tablet  Commonly known as:  K-DUR,KLOR-CON  Take 2 tablets (20 mEq total) by mouth daily.     sildenafil 100 MG tablet  Commonly known as:  VIAGRA  Take 1 tablet (100 mg total) by mouth daily as needed.           Objective:   Physical Exam BP 116/76 mmHg  Pulse 76  Temp(Src) 98.2 F (36.8 C) (Oral)  Ht  (1.753 m)  Wt 198 lb 4 oz (89.926 kg)  BMI 29.26 kg/m2  SpO2 98% General:   Well developed, well nourished . NAD.  HEENT:  Normocephalic . Face symmetric, atraumatic Lower extremities: Calves not TTP, essentially symmetric. Left pretibial area with few blisters, some of them open, clear fluid. No purulent discharge. Some hyperpigmentation but not actual redness or warmness. Psych--  Cognition and judgment appear intact.  Cooperative with normal attention span and concentration.  Behavior appropriate. No anxious or depressed appearing.      Assessment & Plan:

## 2015-08-03 NOTE — Patient Instructions (Signed)
For the next 5 days:  Take  Lasix 3 tablets daily After that stay on 2 tablets every day  Increase the potassium to 2 tablets daily  Please come back in 2 weeks to have blood work (you don't need to see me) , make an appointment  Next visit with me in 6 weeks  Keep the leg elevated at least 2 hour twice a day  Low salt diet  Low-Sodium Eating Plan Sodium raises blood pressure and causes water to be held in the body. Getting less sodium from food will help lower your blood pressure, reduce any swelling, and protect your heart, liver, and kidneys. We get sodium by adding salt (sodium chloride) to food. Most of our sodium comes from canned, boxed, and frozen foods. Restaurant foods, fast foods, and pizza are also very high in sodium. Even if you take medicine to lower your blood pressure or to reduce fluid in your body, getting less sodium from your food is important. WHAT IS MY PLAN? Most people should limit their sodium intake to 2,300 mg a day. Your health care provider recommends that you limit your sodium intake to __________ a day.  WHAT DO I NEED TO KNOW ABOUT THIS EATING PLAN? For the low-sodium eating plan, you will follow these general guidelines:  Choose foods with a % Daily Value for sodium of less than 5% (as listed on the food label).   Use salt-free seasonings or herbs instead of table salt or sea salt.   Check with your health care provider or pharmacist before using salt substitutes.   Eat fresh foods.  Eat more vegetables and fruits.  Limit canned vegetables. If you do use them, rinse them well to decrease the sodium.   Limit cheese to 1 oz (28 g) per day.   Eat lower-sodium products, often labeled as "lower sodium" or "no salt added."  Avoid foods that contain monosodium glutamate (MSG). MSG is sometimes added to [place] food and some canned foods.  Check food labels (Nutrition Facts labels) on foods to learn how much sodium is in one serving.  Eat  more home-cooked food and less restaurant, buffet, and fast food.  When eating at a restaurant, ask that your food be prepared with less salt or none, if possible.  HOW DO I READ FOOD LABELS FOR SODIUM INFORMATION? The Nutrition Facts label lists the amount of sodium in one serving of the food. If you eat more than one serving, you must multiply the listed amount of sodium by the number of servings. Food labels may also identify foods as:  Sodium free--Less than 5 mg in a serving.  Very low sodium--35 mg or less in a serving.  Low sodium--140 mg or less in a serving.  Light in sodium--50% less sodium in a serving. For example, if a food that usually has 300 mg of sodium is changed to become light in sodium, it will have 150 mg of sodium.  Reduced sodium--25% less sodium in a serving. For example, if a food that usually has 400 mg of sodium is changed to reduced sodium, it will have 300 mg of sodium. WHAT FOODS CAN I EAT? Grains Low-sodium cereals, including oats, puffed wheat and rice, and shredded wheat cereals. Low-sodium crackers. Unsalted rice and pasta. Lower-sodium bread.  Vegetables Frozen or fresh vegetables. Low-sodium or reduced-sodium canned vegetables. Low-sodium or reduced-sodium tomato sauce and paste. Low-sodium or reduced-sodium tomato and vegetable juices.  Fruits Fresh, frozen, and canned fruit. Fruit juice.  Meat  and Other Protein Products Low-sodium canned tuna and salmon. Fresh or frozen meat, poultry, seafood, and fish. Lamb. Unsalted nuts. Dried beans, peas, and lentils without added salt. Unsalted canned beans. Homemade soups without salt. Eggs.  Dairy Milk. Soy milk. Ricotta cheese. Low-sodium or reduced-sodium cheeses. Yogurt.  Condiments Fresh and dried herbs and spices. Salt-free seasonings. Onion and garlic powders. Low-sodium varieties of mustard and ketchup. Lemon juice.  Fats and Oils Reduced-sodium salad dressings. Unsalted butter.   Other Unsalted popcorn and pretzels.  The items listed above may not be a complete list of recommended foods or beverages. Contact your dietitian for more options. WHAT FOODS ARE NOT RECOMMENDED? Grains Instant hot cereals. Bread stuffing, pancake, and biscuit mixes. Croutons. Seasoned rice or pasta mixes. Noodle soup cups. Boxed or frozen macaroni and cheese. Self-rising flour. Regular salted crackers. Vegetables Regular canned vegetables. Regular canned tomato sauce and paste. Regular tomato and vegetable juices. Frozen vegetables in sauces. Salted french fries. Olives. Rosita Fire. Relishes. Sauerkraut. Salsa. Meat and Other Protein Products Salted, canned, smoked, spiced, or pickled meats, seafood, or fish. Bacon, ham, sausage, hot dogs, corned beef, chipped beef, and packaged luncheon meats. Salt pork. Jerky. Pickled herring. Anchovies, regular canned tuna, and sardines. Salted nuts. Dairy Processed cheese and cheese spreads. Cheese curds. Blue cheese and cottage cheese. Buttermilk.  Condiments Onion and garlic salt, seasoned salt, table salt, and sea salt. Canned and packaged gravies. Worcestershire sauce. Tartar sauce. Barbecue sauce. Teriyaki sauce. Soy sauce, including reduced sodium. Steak sauce. Fish sauce. Oyster sauce. Cocktail sauce. Horseradish. Regular ketchup and mustard. Meat flavorings and tenderizers. Bouillon cubes. Hot sauce. Tabasco sauce. Marinades. Taco seasonings. Relishes. Fats and Oils Regular salad dressings. Salted butter. Margarine. Ghee. Bacon fat.  Other Potato and tortilla chips. Corn chips and puffs. Salted popcorn and pretzels. Canned or dried soups. Pizza. Frozen entrees and pot pies.  The items listed above may not be a complete list of foods and beverages to avoid. Contact your dietitian for more information. Document Released: 06/01/2002 Document Revised: 12/15/2013 Document Reviewed: 10/14/2013 [facility] Patient Information 2015 [place], [place].  This information is not intended to replace advice given to you by your health care provider. Make sure you discuss any questions you have with your health care provider.

## 2015-08-03 NOTE — Progress Notes (Signed)
Pre visit review using our clinic review tool, if applicable. No additional management support is needed unless otherwise documented below in the visit note. 

## 2015-08-03 NOTE — Assessment & Plan Note (Signed)
Since the last visit, he took doxycycline, ultrasound was negative for DVT. Mild improvement of the edema however on reviewing his chart he has gained weight. At this point I don't think he has a infection, recommend aggressive treatment for edema. Plan: Leg elevation, low-salt diet, increase Lasix from one tablet daily to 2 tablets daily. Also increase potassium supplements. See instructions. BMP in 2 weeks He already has a follow-up appointment scheduled for next month.

## 2015-08-09 ENCOUNTER — Encounter: Payer: Medicare Other | Admitting: Internal Medicine

## 2015-08-15 ENCOUNTER — Other Ambulatory Visit (INDEPENDENT_AMBULATORY_CARE_PROVIDER_SITE_OTHER): Payer: Medicare Other

## 2015-08-15 DIAGNOSIS — R609 Edema, unspecified: Secondary | ICD-10-CM | POA: Diagnosis not present

## 2015-08-15 LAB — BASIC METABOLIC PANEL
BUN: 15 mg/dL (ref 6–23)
CHLORIDE: 104 meq/L (ref 96–112)
CO2: 25 mEq/L (ref 19–32)
Calcium: 9.4 mg/dL (ref 8.4–10.5)
Creatinine, Ser: 0.81 mg/dL (ref 0.40–1.50)
GFR: 122.57 mL/min (ref >60.00)
GLUCOSE: 100 mg/dL — AB (ref 70–99)
POTASSIUM: 3.7 meq/L (ref 3.5–5.1)
SODIUM: 138 meq/L (ref 135–145)

## 2015-09-07 ENCOUNTER — Telehealth: Payer: Self-pay | Admitting: Behavioral Health

## 2015-09-07 ENCOUNTER — Encounter: Payer: Self-pay | Admitting: Behavioral Health

## 2015-09-07 NOTE — Telephone Encounter (Signed)
Pre-Visit Call completed with patient and chart updated.   Pre-Visit Info documented in Specialty Comments under [employer].    

## 2015-09-08 ENCOUNTER — Ambulatory Visit (INDEPENDENT_AMBULATORY_CARE_PROVIDER_SITE_OTHER): Payer: Medicare Other | Admitting: Internal Medicine

## 2015-09-08 ENCOUNTER — Encounter: Payer: Self-pay | Admitting: Internal Medicine

## 2015-09-08 VITALS — BP 126/74 | HR 71 | Temp 98.0°F | Ht 69.0 in | Wt 199.2 lb

## 2015-09-08 DIAGNOSIS — Z1159 Encounter for screening for other viral diseases: Secondary | ICD-10-CM

## 2015-09-08 DIAGNOSIS — Z09 Encounter for follow-up examination after completed treatment for conditions other than malignant neoplasm: Secondary | ICD-10-CM | POA: Insufficient documentation

## 2015-09-08 DIAGNOSIS — E119 Type 2 diabetes mellitus without complications: Secondary | ICD-10-CM

## 2015-09-08 DIAGNOSIS — M8580 Other specified disorders of bone density and structure, unspecified site: Secondary | ICD-10-CM

## 2015-09-08 DIAGNOSIS — Z0000 Encounter for general adult medical examination without abnormal findings: Secondary | ICD-10-CM | POA: Diagnosis not present

## 2015-09-08 DIAGNOSIS — E785 Hyperlipidemia, unspecified: Secondary | ICD-10-CM

## 2015-09-08 DIAGNOSIS — F32A Depression, unspecified: Secondary | ICD-10-CM

## 2015-09-08 DIAGNOSIS — F329 Major depressive disorder, single episode, unspecified: Secondary | ICD-10-CM

## 2015-09-08 DIAGNOSIS — Z23 Encounter for immunization: Secondary | ICD-10-CM

## 2015-09-08 DIAGNOSIS — G35 Multiple sclerosis: Secondary | ICD-10-CM

## 2015-09-08 DIAGNOSIS — N5089 Other specified disorders of the male genital organs: Secondary | ICD-10-CM

## 2015-09-08 LAB — HEMOGLOBIN A1C: Hgb A1c MFr Bld: 6.1 % (ref 4.6–6.5)

## 2015-09-08 MED ORDER — COLLAGENASE 250 UNIT/GM EX OINT: 1.0000 "application " | TOPICAL_OINTMENT | Freq: Every day | CUTANEOUS | Status: DC

## 2015-09-08 NOTE — Progress Notes (Signed)
Subjective:    Patient ID: Oscar Matthews., male    DOB: 22-Jan-1951, 64 y.o.   MRN: [id number]  DOS:  09/08/2015 Type of visit - description : Here with his wife  Here for Medicare AWV: 1. Risk factors based on Past M, S, F history: yes , risk factors reviewed 2. Physical Activities: h/o MS, less active than before   3. Depression/mood: denies problems w/ depression per se   4. Hearing: denies problems, no problems noted   5. ADL's:  takes his own showers, able to dress himself, drives rarely    6. Fall Risk:  he is high risk, had falls, uses a cane , rec PT or a walker (declined) 7.  Home Safety: feels safe at home   8. height, weight, &visual acuity: see VS, uses glasses,  blind R eye, sees eye doctor regulalrly 9.  Counseling: yes, see below   10.   Labs ordered based on risk factors: yes   11. Referral Coordination: if needed   12.   Care Plan-- see a/p   13.  Cognitive Assessment : memory and cognition seemed within normal 14. Care team updated 15. End-of-life care: has HC-POA  In addition, today we discussed the following: Poor compliance: Wife provides all  medications, patient is NOT taking Lexapro, Fosamax, aspirin or Lipitor Edema: About the same, left pretibial skin is still not back to normal. Good compliance with Lasix and potassium Hypogonadism: Wife states he does use AndroGel MS: Apparently taking gabapentin    Review of Systems Constitutional: No fever. No chills. No unexplained wt changes. No unusual sweats  HEENT: No dental problems, no ear discharge, no facial swelling, no voice changes. No eye discharge, no eye  redness , no  intolerance to light   Respiratory: No wheezing , no  difficulty breathing. No cough , no mucus production  Cardiovascular: No CP, leg swelling about the same , no  Palpitations  GI: no nausea, no vomiting, no diarrhea , no  abdominal pain.  No blood in the stools. No dysphagia, no odynophagia    Endocrine: No polyphagia, no  polyuria , no polydipsia  GU: No dysuria, gross hematuria, difficulty urinating. No urinary urgency, no frequency.  Musculoskeletal: No joint swellings or unusual aches or pains  Skin: No change in the color of the skin, palor , no  Rash  Allergic, immunologic: No environmental allergies , no  food allergies  Neurological: No dizziness no  syncope. No headaches. No diplopia, no slurred, no slurred speech, no motor deficits, no facial  Numbness  Hematological: No enlarged lymph nodes, no easy bruising , no unusual bleedings  Psychiatry: No suicidal ideas, no hallucinations, no beavior problems; wife reports occasional confusion, patient denies     Past Medical History  Diagnosis Date  . Diabetes mellitus     adult onset  . ED (erectile dysfunction)   . Dyslipidemia   . Multiple sclerosis 80s    final dx in the 90s, [facility] ([facility])  . Hypogonadism male     Followup at the [place]  . Osteopenia 11/26/2012    Past Surgical History  Procedure Laterality Date  . Umbilical hernia repair      Social History   Social History  . Marital Status: Married    Spouse Name: N/A  . Number of Children: 1  . Years of Education: N/A   Occupational History  . Retired Education officer, community    Social History  Main Topics  . Smoking status: Former Games developer  . Smokeless tobacco: Never Used     Comment: quit in the 80s  . Alcohol Use: Yes     Comment: rarely has  wine   . Drug Use: No  . Sexual Activity: Not on file   Other Topics Concern  . Not on file   Social History Narrative   Lives w/ wife   Has a daughter, 3 G-kids     Family History  Problem Relation Age of Onset  . Coronary artery disease Mother     age of onset?  . Diabetes Father     M and F (late onset)  . Stroke Neg Hx   . Colon cancer Neg Hx   . Prostate cancer Neg Hx   . Hyperthyroidism Mother   . Gout Brother        Medication List       This list is accurate as of: 09/08/15  2:59 PM.  Always  use your most recent med list.               ANDROGEL PUMP 20.25 MG/ACT (1.62%) Gel  Generic drug:  Testosterone  Place 1 application onto the skin daily.     ascorbic acid 250 MG Chew  Commonly known as:  VITAMIN C  Chew 250 mg by mouth daily.     aspirin 81 MG tablet  Take 81 mg by mouth daily.     CALCIUM + D PO  Take 1 tablet by mouth daily.     collagenase ointment  Commonly known as:  SANTYL  Apply 1 application topically daily.     dextroamphetamine 10 MG tablet  Commonly known as:  DEXTROSTAT  Take 10 mg by mouth 2 (two) times daily.     furosemide 20 MG tablet  Commonly known as:  LASIX  Take 2 tablets (40 mg total) by mouth daily.     gabapentin 800 MG tablet  Commonly known as:  NEURONTIN  Take 800 mg by mouth 4 (four) times daily.     multivitamin capsule  Take 1 capsule by mouth daily.     potassium chloride 10 MEQ tablet  Commonly known as:  K-DUR,KLOR-CON  Take 2 tablets (20 mEq total) by mouth daily.     sildenafil 100 MG tablet  Commonly known as:  VIAGRA  Take 1 tablet (100 mg total) by mouth daily as needed.           Objective:   Physical Exam  Musculoskeletal:       Legs:  BP 126/74 mmHg  Pulse 71  Temp(Src) 98 F (36.7 C) (Oral)  Ht 5\' 9"  (1.753 m)  Wt 199 lb 4 oz (90.379 kg)  BMI 29.41 kg/m2  SpO2 97% General:   Well developed, well nourished . NAD.  HEENT:  Normocephalic, atraumatic Lungs:  CTA B Normal respiratory effort, no intercostal retractions, no accessory muscle use. Heart: RRR,  no murmur.  Trace tight  pretibial edema bilaterally, calves symmetric, not  TTP  Abdomen:  Not distended, soft, non-tender.   Neurologic:  alert & oriented X3.  Speech normal, gait assisted by a cane, at baseline Psych--  Cognition and judgment appear intact.  Cooperative with normal attention span and concentration.  Behavior appropriate. No anxious or depressed appearing.    Assessment & Plan:   Problem  List> DM Dyslipidemia Depression Multiple is sclerosis   Dx  80s, f/[facility]y ( [facility]) [facility] Osteopenia  Dx  by dexa  2013, T score -1.6 Edema and stasis dermatitis  become a issues ~ 06-2015, [place] neg DVT Poor compliance  A/P DM: Check A1c. Recommend compliance with aspirin Dyslipidemia: Last LDL 106, not taking Lipitor, recommend to come back in 6 months, we'll recheck his cholesterol and see if still needs statins Depression: Not taking Lexapro, patient reports no problem Hypogonadism: Compliant with HRT, follow-up at the [place] Osteopenia: Not taking Fosamax. Previous on density test denied by insurance, we'll try again Edema-dermatitis: Good compliance with Lasix and potassium, edema seems to be under control but he has a area of skin irritation. Recommend leg elevation, continue Lasix, Santyl

## 2015-09-08 NOTE — Assessment & Plan Note (Signed)
DM: Check A1c. Recommend compliance with aspirin Dyslipidemia: Last LDL 106, not taking Lipitor, recommend to come back in 6 months, we'll recheck his cholesterol and see if still needs statins Depression: Not taking Lexapro, patient reports no problem Hypogonadism: Compliant with HRT, follow-up at the [place] Osteopenia: Not taking Fosamax. Previous on density test denied by insurance, we'll try again Edema-dermatitis: Good compliance with Lasix and potassium, edema seems to be under control but he has a area of skin irritation. Recommend leg elevation, continue Lasix, Santyl

## 2015-09-08 NOTE — Assessment & Plan Note (Addendum)
Td 2008; pneumonia shot 03-2009, prevnar 2015; shingles shot-- reports he had that already Flu shot today  H/o polyps, last Cscope 09-- no polyps cscope-- 06/16/15 w/ Dr. Corrie Dandy D. [place]arin at [facility]; no polyps; mild diverticulosis in the transverse colon; moderate diverticulosis in the descending colon and sigmoid colon; small internal hemorrhoids; follow-up in 5 years. DRE , PSA wnl 2015  counseled about diet,  fall prevention

## 2015-09-08 NOTE — Patient Instructions (Addendum)
Get your blood work before you leave    For  swelling: Continue Lasix and potassium. Leg elevation at least 2 hours twice a day. For the skin: Apply SANTYL until better. Call if worse  Will schedule a bone density test  Take Medications as prescribed  Next visit in 4-6 months for a routine checkup, fasting,  30 minutes    Fall Prevention and Home Safety Falls cause injuries and can affect all age groups. It is possible to use preventive measures to significantly decrease the likelihood of falls. There are many simple measures which can make your home safer and prevent falls. OUTDOORS  Repair cracks and edges of walkways and driveways.  Remove high doorway thresholds.  Trim shrubbery on the main path into your home.  Have good outside lighting.  Clear walkways of tools, rocks, debris, and clutter.  Check that handrails are not broken and are securely fastened. Both sides of steps should have handrails.  Have leaves, snow, and ice cleared regularly.  Use sand or salt on walkways during winter months.  In the garage, clean up grease or oil spills. BATHROOM  Install night lights.  Install grab bars by the toilet and in the tub and shower.  Use non-skid mats or decals in the tub or shower.  Place a plastic non-slip stool in the shower to sit on, if needed.  Keep floors dry and clean up all water on the floor immediately.  Remove soap buildup in the tub or shower on a regular basis.  Secure bath mats with non-slip, double-sided rug tape.  Remove throw rugs and tripping hazards from the floors. BEDROOMS  Install night lights.  Make sure a bedside light is easy to reach.  Do not use oversized bedding.  Keep a telephone by your bedside.  Have a firm chair with side arms to use for getting dressed.  Remove throw rugs and tripping hazards from the floor. KITCHEN  Keep handles on pots and pans turned toward the center of the stove. Use back burners when  possible.  Clean up spills quickly and allow time for drying.  Avoid walking on wet floors.  Avoid hot utensils and knives.  Position shelves so they are not too high or low.  Place commonly used objects within easy reach.  If necessary, use a sturdy step stool with a grab bar when reaching.  Keep electrical cables out of the way.  Do not use floor polish or wax that makes floors slippery. If you must use wax, use non-skid floor wax.  Remove throw rugs and tripping hazards from the floor. STAIRWAYS  Never leave objects on stairs.  Place handrails on both sides of stairways and use them. Fix any loose handrails. Make sure handrails on both sides of the stairways are as long as the stairs.  Check carpeting to make sure it is firmly attached along stairs. Make repairs to worn or loose carpet promptly.  Avoid placing throw rugs at the top or bottom of stairways, or properly secure the rug with carpet tape to prevent slippage. Get rid of throw rugs, if possible.  Have an electrician put in a light switch at the top and bottom of the stairs. OTHER FALL PREVENTION TIPS  Wear low-heel or rubber-soled shoes that are supportive and fit well. Wear closed toe shoes.  When using a stepladder, make sure it is fully opened and both spreaders are firmly locked. Do not climb a closed stepladder.  Add color or contrast paint or  tape to grab bars and handrails in your home. Place contrasting color strips on first and last steps.  Learn and use mobility aids as needed. Install an electrical emergency response system.  Turn on lights to avoid dark areas. Replace light bulbs that burn out immediately. Get light switches that glow.  Arrange furniture to create clear pathways. Keep furniture in the same place.  Firmly attach carpet with non-skid or double-sided tape.  Eliminate uneven floor surfaces.  Select a carpet pattern that does not visually hide the edge of steps.  Be aware of all  pets. OTHER HOME SAFETY TIPS  Set the water temperature for 120 F (48.8 C).  Keep emergency numbers on or near the telephone.  Keep smoke detectors on every level of the home and near sleeping areas. Document Released: 11/30/2002 Document Revised: 06/10/2012 Document Reviewed: 02/29/2012 [facility] Patient Information 2015 [place], [place]. This information is not intended to replace advice given to you by your health care provider. Make sure you discuss any questions you have with your health care provider.   Preventive Care for Adults Ages 37 and over  Blood pressure check.** / Every 1 to 2 years.  Lipid and cholesterol check.**/ Every 5 years beginning at age 37.  Lung cancer screening. / Every year if you are aged 55-80 years and have a 30-pack-year history of smoking and currently smoke or have quit within the past 15 years. Yearly screening is stopped once you have quit smoking for at least 15 years or develop a health problem that would prevent you from having lung cancer treatment.  Fecal occult blood test (FOBT) of stool. / Every year beginning at age 70 and continuing until age 30. You may not have to do this test if you get a colonoscopy every 10 years.  Flexible sigmoidoscopy** or colonoscopy.** / Every 5 years for a flexible sigmoidoscopy or every 10 years for a colonoscopy beginning at age 77 and continuing until age 16.  Hepatitis C blood test.** / For all people born from [birth year] through 1967 and any individual with known risks for hepatitis C.  Abdominal aortic aneurysm (AAA) screening.** / A one-time screening for ages 69 to 35 years who are current or former smokers.  Skin self-exam. / Monthly.  Influenza vaccine. / Every year.  Tetanus, diphtheria, and acellular pertussis (Tdap/Td) vaccine.** / 1 dose of Td every 10 years.  Varicella vaccine.** / Consult your health care provider.  Zoster vaccine.** / 1 dose for adults aged 30 years or older.  Pneumococcal  13-valent conjugate (PCV13) vaccine.** / Consult your health care provider.  Pneumococcal polysaccharide (PPSV23) vaccine.** / 1 dose for all adults aged 58 years and older.  Meningococcal vaccine.** / Consult your health care provider.  Hepatitis A vaccine.** / Consult your health care provider.  Hepatitis B vaccine.** / Consult your health care provider.  Haemophilus influenzae type b (Hib) vaccine.** / Consult your health care provider. **Family history and personal history of risk and conditions may change your health care provider's recommendations. Document Released: 02/05/2002 Document Revised: 12/15/2013 Document Reviewed: 05/07/2011 [facility] Patient Information 2015 [place], [place]. This information is not intended to replace advice given to you by your health care provider. Make sure you discuss any questions you have with your health care provider.

## 2015-09-08 NOTE — Progress Notes (Signed)
Pre visit review using our clinic review tool, if applicable. No additional management support is needed unless otherwise documented below in the visit note. 

## 2015-09-09 LAB — HEPATITIS C ANTIBODY: HCV AB: NEGATIVE

## 2016-01-24 ENCOUNTER — Encounter: Payer: Self-pay | Admitting: Internal Medicine

## 2016-01-24 ENCOUNTER — Ambulatory Visit (INDEPENDENT_AMBULATORY_CARE_PROVIDER_SITE_OTHER): Payer: Medicare Other | Admitting: Internal Medicine

## 2016-01-24 VITALS — BP 124/76 | HR 74 | Temp 98.2°F | Resp 14 | Ht 69.0 in | Wt 194.5 lb

## 2016-01-24 DIAGNOSIS — Z9119 Patient's noncompliance with other medical treatment and regimen: Secondary | ICD-10-CM | POA: Diagnosis not present

## 2016-01-24 DIAGNOSIS — E119 Type 2 diabetes mellitus without complications: Secondary | ICD-10-CM

## 2016-01-24 DIAGNOSIS — Z91199 Patient's noncompliance with other medical treatment and regimen due to unspecified reason: Secondary | ICD-10-CM

## 2016-01-24 DIAGNOSIS — E785 Hyperlipidemia, unspecified: Secondary | ICD-10-CM | POA: Diagnosis not present

## 2016-01-24 DIAGNOSIS — N5089 Other specified disorders of the male genital organs: Secondary | ICD-10-CM | POA: Diagnosis not present

## 2016-01-24 LAB — LIPID PANEL
CHOL/HDL RATIO: 3
Cholesterol: 161 mg/dL (ref 0–200)
HDL: 45.9 mg/dL (ref >39.00)
LDL Cholesterol: 97 mg/dL (ref 0–99)
NONHDL: 114.63
Triglycerides: 87 mg/dL (ref 0.0–149.0)
VLDL: 17.4 mg/dL (ref 0.0–40.0)

## 2016-01-24 NOTE — Patient Instructions (Signed)
BEFORE YOU LEAVE THE OFFICE:  GO TO THE LAB : Get the blood work    GO TO THE FRONT DESK  Schedule a routine office visit or check up to be done in  4 months  No fasting      AFTER YOU LEAVE THE OFFICE:  Strongly consider see a personal and couples counselor.  If you don't hear from this office within 10 days about the bone density tests, please call us

## 2016-01-24 NOTE — Progress Notes (Signed)
Subjective:    Patient ID: Oscar Kern., male    DOB: 21-Feb-1951, 65 y.o.   MRN: [id number]  DOS:  01/24/2016 Type of visit - description : Routine checkup-here with his wife Interval history:  Wife reported that the patient is not taking any medication, patient states "she is telling you a story". Reportedly she provided all the medications and later found them in the floor. The patient is unclear about whether he is taking them or not. " Sometimes I don't take them, don't know why ". The only medication he does take sometimes (per wife) is gabapentin as needed for severe pain   Review of Systems  The patient states he's not depressed, not suicidal, has not lost his desire to live  Past Medical History  Diagnosis Date  . Diabetes mellitus     adult onset  . ED (erectile dysfunction)   . Dyslipidemia   . Multiple sclerosis (HCC) 80s    final [facility] in the 90s[facility]  . Hypogonadism male     Followup at the [place]  . Osteopenia 11/26/2012    Past Surgical History  Procedure Laterality Date  . Umbilical hernia repair      Social History   Social History  . Marital Status: Married    Spouse Name: N/A  . Number of Children: 1  . Years of Education: N/A   Occupational History  . Retired Education officer, community    Social History Main Topics  . Smoking status: Former Games developer  . Smokeless tobacco: Never Used     Comment: quit in the 80s  . Alcohol Use: Yes     Comment: rarely has  wine   . Drug Use: No  . Sexual Activity: Not on file   Other Topics Concern  . Not on file   Social History Narrative   Lives w/ wife   Has a daughter, 3 G-kids        Medication List       This list is accurate as of: 01/24/16  6:32 PM.  Always use your most recent med list.               ANDROGEL PUMP 20.25 MG/ACT (1.62%) Gel  Generic drug:  Testosterone  Place 1 application onto the skin daily. Reported on 01/24/2016     ascorbic acid 250 MG Chew    Commonly known as:  VITAMIN C  Chew 250 mg by mouth daily. Reported on 01/24/2016     aspirin 81 MG tablet  Take 81 mg by mouth daily. Reported on 01/24/2016     CALCIUM + D PO  Take 1 tablet by mouth daily. Reported on 01/24/2016     collagenase ointment  Commonly known as:  SANTYL  Apply 1 application topically daily.     dextroamphetamine 10 MG tablet  Commonly known as:  DEXTROSTAT  Take 10 mg by mouth 2 (two) times daily. Reported on 01/24/2016     furosemide 20 MG tablet  Commonly known as:  LASIX  Take 2 tablets (40 mg total) by mouth daily.     gabapentin 800 MG tablet  Commonly known as:  NEURONTIN  Take 800 mg by mouth 4 (four) times daily. Reported on 01/24/2016     multivitamin capsule  Take 1 capsule by mouth daily. Reported on 01/24/2016     potassium chloride 10 MEQ tablet  Commonly known as:  K-DUR,KLOR-CON  Take 2 tablets (  20 mEq total) by mouth daily.     sildenafil 100 MG tablet  Commonly known as:  VIAGRA  Take 1 tablet (100 mg total) by mouth daily as needed.           Objective:   Physical Exam BP 124/76 mmHg  Pulse 74  Temp(Src) 98.2 F (36.8 C) (Oral)  Resp 14  Ht 5\' 9"  (1.753 m)  Wt 194 lb 8 oz (88.225 kg)  BMI 28.71 kg/m2 General:   Well developed, well nourished . NAD.  HEENT:  Normocephalic , atraumatic Lungs:  CTA B Normal respiratory effort, no intercostal retractions, no accessory muscle use. Heart: RRR,  no murmur.  Trace pretibial edema bilaterally  Skin: Not pale. Not jaundice Neurologic:  alert & oriented in time, space, self, circumstance. Short-term memory seems normal  Speech normal, gait assisted by a cane, seems at baseline Psych--  Cognition and judgment appear intact.  Cooperative with normal attention span and concentration.  Behavior appropriate. No anxious or depressed appearing.      Assessment & Plan:   Assessment: DM Dyslipidemia Depression Multiple is sclerosis   Dx  80s, [facility]ism f/u at the [place] Osteopenia  Dx  by dexa  2013, T score -1.6 Edema and stasis dermatitis  become a issue ~ 06-2015, [place] neg DVT Poor compliance  PLAN DM: Diet control, last A1c satisfactory Dyslipidemia: Check FLP Non-compliance: That is the main issue today, no suicidal, he is alert oriented 3 and able to make his own decisions. Benefits of taking medications discussed. I do think he needs counseling as well as they need couples counseling (wife very upset, they argue through the visit). The patient's wife agree. Information regards local counselors provided. Also, he has 2 appointments in February to see his PCP and neurologist at the [facility], strongly encouraged to discuss the situation with them, they may have resources to help him. Osteopenia: So far unable to get a bone density test. We'll try one more time. Advise patient to call if he does not hear from [place] in 10 days RTC 4 months   Today, I spent more than 25   min with the patient: >50% of the time counseling regards compliance with medication, benefits of medicines, listening to the patient's and his wife's  concerns.

## 2016-01-24 NOTE — Progress Notes (Signed)
Pre visit review using our clinic review tool, if applicable. No additional management support is needed unless otherwise documented below in the visit note. 

## 2016-02-14 ENCOUNTER — Ambulatory Visit (HOSPITAL_BASED_OUTPATIENT_CLINIC_OR_DEPARTMENT_OTHER)
Admission: RE | Admit: 2016-02-14 | Discharge: 2016-02-14 | Disposition: A | Discharge status: Home / Self Care | Payer: Medicare Other | Source: Ambulatory Visit | Attending: Internal Medicine | Admitting: Internal Medicine

## 2016-02-14 DIAGNOSIS — M8580 Other specified disorders of bone density and structure, unspecified site: Secondary | ICD-10-CM | POA: Diagnosis not present

## 2016-02-14 DIAGNOSIS — N5089 Other specified disorders of the male genital organs: Secondary | ICD-10-CM | POA: Diagnosis present

## 2016-02-14 DIAGNOSIS — G710 Muscular dystrophy: Secondary | ICD-10-CM | POA: Diagnosis not present

## 2016-02-14 DIAGNOSIS — Z87891 Personal history of nicotine dependence: Secondary | ICD-10-CM | POA: Insufficient documentation

## 2016-03-02 ENCOUNTER — Telehealth: Payer: Self-pay | Admitting: Internal Medicine

## 2016-03-02 NOTE — Telephone Encounter (Signed)
This was the number given may want to try the number that's in the system [phone]

## 2016-03-02 NOTE — Telephone Encounter (Signed)
Tried calling Pt at list number, was informed I had the wrong number??

## 2016-03-02 NOTE — Telephone Encounter (Signed)
Caller name:Dawn,[name] T Relation to RQ:[id number]  Call back number: [phone]  Pharmacy:  Reason for call:  Spouse would like to discuss patient last lab results taken 01/24/2016.

## 2016-03-02 NOTE — Telephone Encounter (Signed)
Spoke w/ Pt, informed him of lab results. Instructed him that if his wife, Bonita Quin has any further questions we will be back on Monday at 0800. Pt verbalized understanding.

## 2016-03-13 ENCOUNTER — Encounter: Payer: Self-pay | Admitting: Internal Medicine

## 2016-03-13 DIAGNOSIS — N529 Male erectile dysfunction, unspecified: Secondary | ICD-10-CM

## 2016-03-14 NOTE — Telephone Encounter (Signed)
Referral placed.

## 2016-03-14 NOTE — Telephone Encounter (Signed)
Enter a urology referral, DX ED  Received: Today    Wanda Plump, MD  Conrad [place], CMA

## 2016-03-21 ENCOUNTER — Encounter: Payer: Self-pay | Admitting: Internal Medicine

## 2016-04-12 ENCOUNTER — Encounter: Payer: Self-pay | Admitting: Internal Medicine

## 2016-04-12 DIAGNOSIS — G35 Multiple sclerosis: Secondary | ICD-10-CM

## 2016-04-13 NOTE — Telephone Encounter (Signed)
Home Health referral for PT placed.

## 2016-04-13 NOTE — Telephone Encounter (Signed)
Patient will need Face to Face visit, please see below. Thanks         ----- Message -----     From: Donia Pounds     Sent: 04/13/2016 10:42 AM      To: Bobbe Medico,        In reviewing this client's chart, our regulations under Medicare are the same as the [place]. We cannot provide skilled PT unless the client has a "Face to Face" with a physician documenting the need for skilled care.         At this time and based on his clinicals, we cannot provide services.        I hope this information helps.

## 2016-04-13 NOTE — Telephone Encounter (Signed)
MyChart message sent to Pt informing him to schedule an appt at his convenience.

## 2016-04-13 NOTE — Telephone Encounter (Signed)
[facility]-- needs PT at home, he is homebound, DX multiple sclerosis  Received: Today    Wanda Plump, MD  Conrad [place], CMA

## 2016-04-18 ENCOUNTER — Ambulatory Visit (INDEPENDENT_AMBULATORY_CARE_PROVIDER_SITE_OTHER): Payer: Medicare Other | Admitting: Internal Medicine

## 2016-04-18 ENCOUNTER — Encounter: Payer: Self-pay | Admitting: Internal Medicine

## 2016-04-18 VITALS — BP 124/86 | HR 85 | Temp 98.0°F | Resp 16 | Ht 69.0 in | Wt 193.0 lb

## 2016-04-18 DIAGNOSIS — Z9119 Patient's noncompliance with other medical treatment and regimen: Secondary | ICD-10-CM | POA: Diagnosis not present

## 2016-04-18 DIAGNOSIS — Z91199 Patient's noncompliance with other medical treatment and regimen due to unspecified reason: Secondary | ICD-10-CM

## 2016-04-18 DIAGNOSIS — G35 Multiple sclerosis: Secondary | ICD-10-CM

## 2016-04-18 DIAGNOSIS — Z09 Encounter for follow-up examination after completed treatment for conditions other than malignant neoplasm: Secondary | ICD-10-CM

## 2016-04-18 NOTE — Progress Notes (Signed)
Pre visit review using our clinic review tool, if applicable. No additional management support is needed unless otherwise documented below in the visit note/SLS  

## 2016-04-18 NOTE — Progress Notes (Signed)
Subjective:    Patient ID: Oscar Matthews., male    DOB: 03-Feb-1951, 65 y.o.   MRN: [id number]  DOS:  04/18/2016 Type of visit - description : To discuss MS  Interval history: MS: Patient reports he is fine, wife reports he is stumbling frequently, [facility], they [facility] sc[facility] but are unable to send physical therapy to home.   Not taking his medications except gabapentin and metadate    Review of Systems Denies any major recent injury. No head trauma, neck pain. Admits to lack of motivation but denies depression Not taking Lasix but no major problems with swelling. Continue with leg pain, on gabapentin  Past Medical History  Diagnosis Date  . Diabetes mellitus     adult onset  . ED (erectile dysfunction)   . Dyslipidemia   . Multiple sclerosis (HCC) 80s    final dx in the 90s, [facility]  . Hypogonadism male     Followup at the [place]  . Osteopenia 11/26/2012    Past Surgical History  Procedure Laterality Date  . Umbilical hernia repair      Social History   Social History  . Marital Status: Married    Spouse Name: N/A  . Number of Children: 1  . Years of Education: N/A   Occupational History  . Retired Education officer, community    Social History Main Topics  . Smoking status: Former Games developer  . Smokeless tobacco: Never Used     Comment: quit in the 80s  . Alcohol Use: Yes     Comment: rarely has  wine   . Drug Use: No  . Sexual Activity: Not on file   Other Topics Concern  . Not on file   Social History Narrative   Lives w/ wife   Has a daughter, 3 G-kids        Medication List       This list is accurate as of: 04/18/16 11:59 PM.  Always use your most recent med list.               aspirin 81 MG tablet  Take 81 mg by mouth daily. Reported on 01/24/2016     CALCIUM + D PO  Take 1 tablet by mouth daily. Reported on 04/18/2016     collagenase ointment  Commonly known as:  SANTYL  Apply 1 application  topically daily.     furosemide 20 MG tablet  Commonly known as:  LASIX  Take 2 tablets (40 mg total) by mouth daily.     gabapentin 800 MG tablet  Commonly known as:  NEURONTIN  Take 800 mg by mouth 4 (four) times daily. Reported on 01/24/2016     methylphenidate 10 MG CR capsule  Commonly known as:  METADATE CD  Take 10 mg by mouth 2 (two) times daily.     multivitamin capsule  Take 1 capsule by mouth daily. Reported on 04/18/2016     potassium chloride 10 MEQ tablet  Commonly known as:  K-DUR,KLOR-CON  Take 2 tablets (20 mEq total) by mouth daily.           Objective:   Physical Exam BP 124/86 mmHg  Pulse 85  Temp(Src) 98 F (36.7 C) (Oral)  Resp 16  Ht  (1.753 m)  Wt 193 lb (87.544 kg)  BMI 28.49 kg/m2  SpO2 98% General:   Well developed, no distress, walks with difficulty assisted  by a cane.  HEENT:  Normocephalic . Face symmetric, atraumatic Lungs:  CTA B Normal respiratory effort, no intercostal retractions, no accessory muscle use. Heart: RRR,  no murmur.  Trace pretibial edema bilaterally  Skin: Not pale. Not jaundice Neurologic:  alert & oriented X3.  Speech normal, seems at baseline Psych--  Cognition and judgment appear intact.  Cooperative with normal attention span and concentration.  Behavior appropriate. No anxious or depressed appearing.      Assessment & Plan:   Assessment: DM Dyslipidemia Depression Multiple is sclerosis   Dx  80s, [facility] pain-- [facility] at the [facility] Osteopenia  Dx  by dexa  2013, T score -1.6.  DEXA-2017: T score -1.3, rx calcium-vitamin D Edema and stasis dermatitis  become a issue ~ 06-2015, [place] neg DVT Poor compliance  PLAN MS: Follow-up on the [place], currently on Metadate. H/o stumbling, PT will be helpful, will arrange home PT. Lower extremity edema: Currently not taking Lasix low potassium but states she will Osteopenia: Currently not taking  calcium-vitamin D but states he will Noncompliance: See previous entry, the climate to see a counselor but states he will,information provided. RTC June as already scheduled

## 2016-04-18 NOTE — Patient Instructions (Signed)
Will refer you for physical therapy at home  Consider see a counselor  Take the medications as prescribed

## 2016-04-19 ENCOUNTER — Other Ambulatory Visit: Payer: Self-pay

## 2016-04-19 DIAGNOSIS — G35 Multiple sclerosis: Secondary | ICD-10-CM

## 2016-04-19 NOTE — Assessment & Plan Note (Signed)
MS: Follow-up on the [place], currently on Metadate. H/o stumbling, PT will be helpful, will arrange home PT. Lower extremity edema: Currently not taking Lasix low potassium but states she will Osteopenia: Currently not taking calcium-vitamin D but states he will Noncompliance: See previous entry, the climate to see a counselor but states he will,information provided. RTC June as already scheduled

## 2016-04-24 ENCOUNTER — Telehealth: Payer: Self-pay | Admitting: Internal Medicine

## 2016-04-24 NOTE — Telephone Encounter (Signed)
thx

## 2016-04-24 NOTE — Telephone Encounter (Signed)
Caller name: Apolonio Schneiders Relationship to patient:  [facility] Can be reached: [phone]    Reason for call: FYI:  Called to inform provider that they will be starting service with patient today.

## 2016-04-25 ENCOUNTER — Telehealth: Payer: Self-pay | Admitting: Internal Medicine

## 2016-04-25 NOTE — Telephone Encounter (Signed)
Please advise.//AB/CMA 

## 2016-04-25 NOTE — Telephone Encounter (Signed)
Called and [facility] for Oscar Matthews with Genevieve Norlander @ 10:41am @ [phone]) giving her verbal orders for PT 2x week for 9 weeks for balance,gait, and strength training.  Asked her to give me a call back if she has any questions or concerns.//AB/CMA

## 2016-04-25 NOTE — Telephone Encounter (Signed)
That is ok, thx  

## 2016-04-25 NOTE — Telephone Encounter (Signed)
Caller name: Revonda Standard with Genevieve Norlander Can be reached: [phone]   Reason for call: PT with Genevieve Norlander - needing VO for PT 2x week for 9 weeks for balance, gait, and strength training.

## 2016-04-30 ENCOUNTER — Telehealth: Payer: Self-pay | Admitting: *Deleted

## 2016-04-30 NOTE — Telephone Encounter (Signed)
Received Medication Issue communication/Order; forwarded to provider/SLS 05/08

## 2016-05-14 ENCOUNTER — Telehealth: Payer: Self-pay | Admitting: *Deleted

## 2016-05-14 NOTE — Telephone Encounter (Signed)
Forwarded to Dr. Paz. JG//CMA 

## 2016-05-16 ENCOUNTER — Telehealth: Payer: Self-pay | Admitting: *Deleted

## 2016-05-16 NOTE — Telephone Encounter (Signed)
Opened in error. JG//CMA 

## 2016-05-17 NOTE — Telephone Encounter (Signed)
Signed forms faxed successfully to [facility]. Sent for scanning. JG//CMA

## 2016-05-29 ENCOUNTER — Ambulatory Visit (INDEPENDENT_AMBULATORY_CARE_PROVIDER_SITE_OTHER): Payer: Medicare Other | Admitting: Internal Medicine

## 2016-05-29 ENCOUNTER — Encounter: Payer: Self-pay | Admitting: Internal Medicine

## 2016-05-29 VITALS — BP 124/76 | HR 79 | Temp 98.6°F | Ht 69.0 in | Wt 190.0 lb

## 2016-05-29 DIAGNOSIS — G35 Multiple sclerosis: Secondary | ICD-10-CM | POA: Diagnosis not present

## 2016-05-29 DIAGNOSIS — M8580 Other specified disorders of bone density and structure, unspecified site: Secondary | ICD-10-CM

## 2016-05-29 DIAGNOSIS — R600 Localized edema: Secondary | ICD-10-CM | POA: Diagnosis not present

## 2016-05-29 NOTE — Patient Instructions (Signed)
   GO TO THE FRONT DESK Schedule your next appointment for a physical exam in 4 months , fasting

## 2016-05-29 NOTE — Progress Notes (Signed)
Pre visit review using our clinic review tool, if applicable. No additional management support is needed unless otherwise documented below in the visit note. 

## 2016-05-29 NOTE — Progress Notes (Signed)
Subjective:    Patient ID: Oscar Matthews., male    DOB: 1951-12-07, 65 y.o.   MRN: [id number]  DOS:  05/29/2016 Type of visit - description : f/u, Here with his wife Interval history: Doing physical therapy, helping significantly according to the wife. Swelling: Decided not to take Lasix and potassium Not taking calcium or vitamin D   Review of Systems Denies chest pain or difficulty breathing. Edema at baseline No anxiety or depression  Past Medical History  Diagnosis Date  . Diabetes mellitus     adult onset  . ED (erectile dysfunction)   . Dyslipidemia   . Multiple sclerosis (HCC) 80s    final dx in the 90s[facility]  . Hypogonadism male     Followup at the [place]  . Osteopenia 11/26/2012    Past Surgical History  Procedure Laterality Date  . Umbilical hernia repair      Social History   Social History  . Marital Status: Married    Spouse Name: N/A  . Number of Children: 1  . Years of Education: N/A   Occupational History  . Retired Education officer, community    Social History Main Topics  . Smoking status: Former Games developer  . Smokeless tobacco: Never Used     Comment: quit in the 80s  . Alcohol Use: Yes     Comment: rarely has  wine   . Drug Use: No  . Sexual Activity: Not on file   Other Topics Concern  . Not on file   Social History Narrative   Lives w/ wife   Has a daughter, 3 G-kids        Medication List       This list is accurate as of: 05/29/16 11:59 PM.  Always use your most recent med list.               aspirin 81 MG tablet  Take 81 mg by mouth daily. Reported on 01/24/2016     CALCIUM + D PO  Take 1 tablet by mouth daily. Reported on 04/18/2016     collagenase ointment  Commonly known as:  SANTYL  Apply 1 application topically daily.     gabapentin 800 MG tablet  Commonly known as:  NEURONTIN  Take 800 mg by mouth 4 (four) times daily. Reported on 01/24/2016     methylphenidate 10 MG CR capsule  Commonly known  as:  METADATE CD  Take 10 mg by mouth 2 (two) times daily.     multivitamin capsule  Take 1 capsule by mouth daily. Reported on 04/18/2016           Objective:   Physical Exam BP 124/76 mmHg  Pulse 79  Temp(Src) 98.6 F (37 C) (Oral)  Ht 5\' 9"  (1.753 m)  Wt 190 lb (86.183 kg)  BMI 28.05 kg/m2  SpO2 97% General:   Well developed, no distress, walks with difficulty assisted by a cane.  HEENT:  Normocephalic . Face symmetric, atraumatic Lungs:  CTA B Normal respiratory effort, no intercostal retractions, no accessory muscle use. Heart: RRR,  no murmur.  Trace pretibial edema bilaterally  Skin: Not pale. Not jaundice Neurologic:  alert & oriented X3.  Speech normal, seems at baseline Psych--  Cognition and judgment appear intact.  Cooperative with normal attention span and concentration.  Behavior appropriate. No anxious or depressed appearing.     Assessment & Plan:   Assessment: DM -- f/u by  pcp Dyslipidemia Depression Multiple is sclerosis   Dx  80s, [facility]-- [facility] Osteopenia  Dx  by dexa  2013, T score -1.6.  DEXA-2017: T score -1.3, rx calcium-vitamin D Edema and stasis dermatitis  become a issue ~ 06-2015, [place] neg DVT Poor compliance   PLAN Lower extremity edema: Decided not to take Lasix or potassium Osteopenia: Decided not to take calcium and vitamin D MS: Doing PT at home, wife reports significant improvement, continue with PT as needed RTC 4 months, CPX

## 2016-05-30 NOTE — Assessment & Plan Note (Signed)
Lower extremity edema: Decided not to take Lasix or potassium Osteopenia: Decided not to take calcium and vitamin D MS: Doing PT at home, wife reports significant improvement, continue with PT as needed RTC 4 months, CPX

## 2016-05-31 ENCOUNTER — Telehealth: Payer: Self-pay | Admitting: *Deleted

## 2016-05-31 NOTE — Telephone Encounter (Signed)
Forwarded to Dr. Paz. JG//CMA 

## 2016-06-04 NOTE — Telephone Encounter (Signed)
Signed forms faxed successfully to [facility]. Sent for scanning. JG//CMA 

## 2016-06-15 ENCOUNTER — Telehealth: Payer: Self-pay | Admitting: Internal Medicine

## 2016-06-15 NOTE — Telephone Encounter (Signed)
Caller name: Relationship to patient: [facility] PT Can be reached: [phone]   Reason for call: Patient fell 4 times on Wednesday because he was not using his assisted device. Patient states he was not injured. Safety protocols were discussed with patient. Please call patient to also discuss the importance of using assisted devices that he has.

## 2016-06-15 NOTE — Telephone Encounter (Signed)
Please call patient, see if he has any injury to we can help him with. Also does he need to do more physical therapy? Encourage to use assisting devices all the time

## 2016-06-15 NOTE — Telephone Encounter (Signed)
FYI

## 2016-06-15 NOTE — Telephone Encounter (Signed)
LMOM informing Pt to return call.  

## 2016-06-18 NOTE — Telephone Encounter (Signed)
Yes please, extend his PT

## 2016-06-18 NOTE — Telephone Encounter (Signed)
Spoke w/ Bonita Quin (Pt's wife), she informed me that Pt has indeed fallen but was not hurt. [facility] which he uses some, he is still using his cane when ambulating. Bonita Quin also informed me that she has previously bought a 2 wheel walker but Pt refuses to use. Asked if she felt PT was helping, she stated she didn't know but is worried that stopping PT will make him weaker, she believes he has 1 more visit remaining with PT. Informed her I could ask Dr. Drue Novel about renewing PT for further visits and instructed her to discuss w/ Pt the importance of using walker when ambulating. Bonita Quin verbalized understanding.

## 2016-06-18 NOTE — Telephone Encounter (Signed)
Returned Call  Received: Today    Vilinda Flake, CMA    Phone Number: [phone]            Patient's wife Bonita Quin) returned a call from Friday. Plse call back

## 2016-06-18 NOTE — Telephone Encounter (Signed)
LMOM for Oscar Matthews at [phone] asking for call back regarding Pt and extending PT dates.

## 2016-06-22 ENCOUNTER — Telehealth: Payer: Self-pay | Admitting: Internal Medicine

## 2016-06-22 NOTE — Telephone Encounter (Signed)
Spoke w/ Allison, verbal orders given.  

## 2016-06-22 NOTE — Telephone Encounter (Signed)
Caller name: Revonda Standard  Relation to pt: PT from [place]  Call back number: [phone]    Reason for call:  Requesting verbal orders to recertify Physical Therapy 2x for 4 weeks

## 2016-07-11 ENCOUNTER — Telehealth: Payer: Self-pay

## 2016-07-11 NOTE — Telephone Encounter (Signed)
Home Health Certification and Plan of Care for dates 06/23/2016-08/21/2016, forms signed and faxed to [facility] at [phone]. Forms sent for scanning.

## 2016-08-07 ENCOUNTER — Telehealth: Payer: Self-pay

## 2016-08-07 NOTE — Telephone Encounter (Signed)
Received Summary report from [place] of PT discharge due to goals met. Forms signed by PCP and faxed to [facility] at [phone]. Forms sent for scanning.

## 2016-10-01 ENCOUNTER — Telehealth: Payer: Self-pay

## 2016-10-01 ENCOUNTER — Ambulatory Visit (INDEPENDENT_AMBULATORY_CARE_PROVIDER_SITE_OTHER): Payer: Medicare Other | Admitting: Internal Medicine

## 2016-10-01 ENCOUNTER — Encounter: Payer: Self-pay | Admitting: Internal Medicine

## 2016-10-01 VITALS — BP 112/79 | HR 98 | Temp 98.0°F | Resp 16 | Ht 69.0 in | Wt 188.2 lb

## 2016-10-01 DIAGNOSIS — R739 Hyperglycemia, unspecified: Secondary | ICD-10-CM | POA: Diagnosis not present

## 2016-10-01 DIAGNOSIS — Z23 Encounter for immunization: Secondary | ICD-10-CM | POA: Diagnosis not present

## 2016-10-01 DIAGNOSIS — S80812A Abrasion, left lower leg, initial encounter: Secondary | ICD-10-CM

## 2016-10-01 DIAGNOSIS — E039 Hypothyroidism, unspecified: Secondary | ICD-10-CM

## 2016-10-01 DIAGNOSIS — L989 Disorder of the skin and subcutaneous tissue, unspecified: Secondary | ICD-10-CM

## 2016-10-01 DIAGNOSIS — Z0000 Encounter for general adult medical examination without abnormal findings: Secondary | ICD-10-CM

## 2016-10-01 LAB — BASIC METABOLIC PANEL
BUN: 14 mg/dL (ref 6–23)
CALCIUM: 9.2 mg/dL (ref 8.4–10.5)
CO2: 29 meq/L (ref 19–32)
CREATININE: 0.85 mg/dL (ref 0.40–1.50)
Chloride: 104 mEq/L (ref 96–112)
GFR: 115.54 mL/min (ref >60.00)
GLUCOSE: 104 mg/dL — AB (ref 70–99)
Potassium: 4.2 mEq/L (ref 3.5–5.1)
Sodium: 139 mEq/L (ref 135–145)

## 2016-10-01 LAB — HEMOGLOBIN A1C: HEMOGLOBIN A1C: 6.1 % (ref 4.6–6.5)

## 2016-10-01 LAB — LIPID PANEL
CHOL/HDL RATIO: 4
Cholesterol: 167 mg/dL (ref 0–200)
HDL: 45.6 mg/dL (ref >39.00)
LDL CALC: 98 mg/dL (ref 0–99)
NONHDL: 121.29
TRIGLYCERIDES: 114 mg/dL (ref 0.0–149.0)
VLDL: 22.8 mg/dL (ref 0.0–40.0)

## 2016-10-01 LAB — TSH: TSH: 0.7 u[IU]/mL (ref 0.35–4.50)

## 2016-10-01 MED ORDER — MUPIROCIN 2 % EX OINT: 1.0000 "application " | TOPICAL_OINTMENT | Freq: Two times a day (BID) | CUTANEOUS | 0 refills | Status: DC

## 2016-10-01 NOTE — Patient Instructions (Addendum)
GO TO THE LAB : Get the blood work     GO TO THE FRONT DESK Schedule your next appointment for a  checkup in 4 months   Apply Bactroban to the leg twice a day for 2 weeks. Continue Santyl

## 2016-10-01 NOTE — Progress Notes (Signed)
Subjective:    Patient ID: Oscar Kernoy D Mengel Jr., male    DOB: [birth year], 65 y.o.   MRN: [id number]  DOS:  10/01/2016 Type of visit - description : cpx Interval history: Here with his wife. Medications reviewed, good compliance. He still has frequent falls. No major injury ; he does have a excoriation at the left leg persistently.   Review of Systems  Constitutional: No fever. No chills. No unexplained wt changes. No unusual sweats  HEENT: No dental problems, no ear discharge, no facial swelling, no voice changes. No eye discharge, no eye  redness , no  intolerance to light   Respiratory: No wheezing , no  difficulty breathing. No cough , no mucus production  Cardiovascular: No CP, no leg swelling , no  Palpitations  GI: no nausea, no vomiting, no diarrhea , no  abdominal pain.  No blood in the stools. No dysphagia, no odynophagia    Endocrine: No polyphagia, no polyuria , no polydipsia  GU: No dysuria, gross hematuria, difficulty urinating. No urinary urgency, no frequency.  Musculoskeletal:  Has MS  Skin:   no  Rash  Allergic, immunologic: No environmental allergies , no  food allergies  Neurological: No dizziness no  syncope. No headaches. No diplopia, no slurred, no slurred speech, no motor deficits, no facial  Numbness  Hematological: No enlarged lymph nodes, no easy bruising , no unusual bleedings  Psychiatry: No suicidal ideas, no hallucinations, no beavior problems, no confusion.  No unusual/severe anxiety, no depression  Past Medical History:  Diagnosis Date  . Diabetes mellitus    adult onset  . Dyslipidemia   . ED (erectile dysfunction)   . Hypogonadism male    [facility]  . Multiple sclerosis (HCC) 80s   final dx in the 90s[facility]  . Osteopenia 11/26/2012    Past Surgical History:  Procedure Laterality Date  . UMBILICAL HERNIA REPAIR      Social History   Social History  . Marital status: Married   Spouse name: N/A  . Number of children: 1  . Years of education: N/A   Occupational History  . Retired Education officer, communitydentist    Social History Main Topics  . Smoking status: Former Games developermoker  . Smokeless tobacco: Never Used     Comment: quit in the 80s  . Alcohol use Yes     Comment: rarely has  wine   . Drug use: No  . Sexual activity: Not on file   Other Topics Concern  . Not on file   Social History Narrative   Lives w/ wife   Has a daughter, 3 G-kids     Family History  Problem Relation Age of Onset  . Coronary artery disease Mother     age of onset?  Marland Kitchen. Hyperthyroidism Mother   . Diabetes Father     M and F (late onset)  . Gout Brother   . Stroke Neg Hx   . Colon cancer Neg Hx   . Prostate cancer Neg Hx        Medication List       Accurate as of 10/01/16 11:24 AM. Always use your most recent med list.          aspirin 81 MG tablet Take 81 mg by mouth daily. Reported on 01/24/2016   CALCIUM + D PO Take 1 tablet by mouth daily. Reported on 04/18/2016   collagenase ointment Commonly known as:  SANTYL Apply 1 application topically daily.   gabapentin 800 MG tablet Commonly known as:  NEURONTIN Take 800 mg by mouth 4 (four) times daily.   gabapentin 800 MG tablet Commonly known as:  NEURONTIN Take 800 mg by mouth 4 (four) times daily. Reported on 01/24/2016   methylphenidate 10 MG CR capsule Commonly known as:  METADATE CD Take 10 mg by mouth 2 (two) times daily.   methylphenidate 10 MG CR capsule Commonly known as:  METADATE CD Take 36 mg by mouth daily.   multivitamin capsule Take 1 capsule by mouth daily. Reported on 04/18/2016   MYRBETRIQ 50 MG Tb24 tablet Generic drug:  mirabegron ER Take 50 mg by mouth daily.   RAPAFLO 8 MG Caps capsule Generic drug:  silodosin Take 8 mg by mouth daily.   Teriflunomide 14 MG Tabs Take 14 mg by mouth once.          Objective:   Physical Exam BP 112/79 (BP Location: Right Arm, Patient Position: Sitting, Cuff  Size: Normal)   Pulse 98   Temp 98 F (36.7 C) (Oral)   Resp 16   Ht 5\' 9"  (1.753 m)   Wt 188 lb 3.2 oz (85.4 kg)   SpO2 97%   BMI 27.79 kg/m   General:   Well developed, well nourished . NAD.  HEENT:  Normocephalic . Face symmetric, atraumatic Lungs:  CTA B Normal respiratory effort, no intercostal retractions, no accessory muscle use. Heart: RRR,  no murmur.  No pretibial edema bilaterally  Abdomen:  Not distended, soft, non-tender. Exam performed the patient sitting in a chair.  Skin: L pretibial area, see picture Neurologic:  alert & oriented X3.  Speech normal, gait c/w MS  Psych: Cognition and judgment appear intact.  Cooperative with normal attention span and concentration.  Behavior appropriate. No anxious or depressed appearing.        Assessment & Plan:   Assessment: DM -- f/u by pcp Dyslipidemia Depression NEURO: --Multiple is sclerosis   Dx  80s[facility] -- [place] -- s/c > 18 PT visits 2017 --Urinary incontinence  Dr Jinny Sanders R leg pain-- [facility] Osteopenia  Dx  by dexa  2013, T score -1.6.  DEXA-2017: T score -1.3, rx calcium-vitamin D Edema and stasis dermatitis  become a issue ~ 06-2015, [place] neg DVT Poor compliance   PLAN DM: Diet control, check A1c High cholesterol: Diet control, check a FLP MS, frequent falls: Counseled to the best of my ability (s/p extensive PT) Excoriation, left leg: Area has not healed in about a year. Continue Santyl, Bactroban for 2 weeks, refer to dermatology, reassess in 4 months.  RTC 4 months

## 2016-10-01 NOTE — Telephone Encounter (Signed)
-----   Message from Wanda Plump, MD sent at 10/01/2016  1:49 PM EDT ----- Regarding: call pt  Please tell him after I review the chart, I decided to refer him to dermatology regards the left leg excoriation.

## 2016-10-01 NOTE — Assessment & Plan Note (Addendum)
Td 2008; pneumonia shot 03-2009, prevnar 2015; shingles shot-- reports he had that already Flu shot -- today   H/o polyps, last Cscope 09-- no polyps cscope-- 06/16/15 w/ Dr. Corrie DandyMary D. Shearin [facility]; no polyps; + diverticulosis, small internal hemorrhoids; follow-up in 5 years. DRE , PSA wnl 2015 (also sees urology) counseled    fall prevention  discussed although he had >  18 visits with PT and reports he got good advice. Strongly encouraged to use the scooter and a cane consistently. Has a healthcare power of attorney

## 2016-10-01 NOTE — Assessment & Plan Note (Signed)
DM: Diet control, check A1c High cholesterol: Diet control, check a FLP MS, frequent falls: Counseled to the best of my ability (s/p extensive PT) Excoriation, left leg: Area has not healed in about a year. Continue Santyl, Bactroban for 2 weeks, refer to dermatology, reassess in 4 months.  RTC 4 months

## 2016-10-01 NOTE — Telephone Encounter (Signed)
Patient would like to see someone in [facility].    KP

## 2016-10-01 NOTE — Progress Notes (Signed)
Pre visit review using our clinic review tool, if applicable. No additional management support is needed unless otherwise documented below in the visit note. 

## 2016-10-01 NOTE — Telephone Encounter (Signed)
Referral fax[facility] to [facility].

## 2016-11-27 ENCOUNTER — Encounter: Payer: Self-pay | Admitting: Internal Medicine

## 2016-11-28 ENCOUNTER — Telehealth: Payer: Self-pay | Admitting: Internal Medicine

## 2016-11-28 DIAGNOSIS — G35 Multiple sclerosis: Secondary | ICD-10-CM

## 2016-11-28 NOTE — Telephone Encounter (Signed)
Lift Chair prescription placed at front desk for pick up. Pt and wife, Bonita Quin, informed via MyChart.

## 2016-11-28 NOTE — Telephone Encounter (Signed)
DME order for Lift chair printed, awaiting MD signature.

## 2016-11-28 NOTE — Telephone Encounter (Signed)
See message below, okay to write a prescription. Would be better if you contact the wife directly so she can tell us exactly how to write the prescription.  ================= Dr. Drue Novel, Oscar Matthews's mobility has gotten worse among other things. Because of his Ms pain he cannot get comfortable in the bed, so he sleeps in an upright chair. I am requesting a prescription for a lift/recliner so he can get comfortable and hopefully eliminate some of his pain. I've spoken to [place] [employer] and they will cover the motorized portion of the chair with a doctor's prescription. Please write a prescription for him to get the chair. If he gets it this month, he won't have to pay a deductible. Thank you. Herminio Heads.

## 2017-01-07 ENCOUNTER — Telehealth: Payer: Self-pay | Admitting: *Deleted

## 2017-01-07 NOTE — Telephone Encounter (Signed)
Scheduled for 02/01/17 @10 . Confirmed with wife.

## 2017-01-30 NOTE — Progress Notes (Deleted)
Pre visit review using our clinic review tool, if applicable. No additional management support is needed unless otherwise documented below in the visit note. 

## 2017-01-30 NOTE — Progress Notes (Deleted)
Subjective:   Oscar Matthews. is a 66 y.o. male who presents for Medicare Annual/Subsequent preventive examination.  Review of Systems:  No ROS.  Medicare Wellness Visit.    Sleep patterns: {SX; SLEEP PATTERNS:18802::"feels rested on waking","does not get up to void","gets up *** times nightly to void","sleeps *** hours nightly"}.   Home Safety/Smoke Alarms:   Living environment; residence and Firearm Safety: {Rehab home environment / accessibility:30080::"no firearms","firearms stored safely"}. Seat Belt Safety/Bike Helmet: Wears seat belt.   Counseling:   Eye Exam-  Dental-  Male:   CCS-  Last  06/16/15: diverticulosis Recall 5 years per external report. PSA- Lab Results  Component Value Date   PSA 1.12 08/05/2014   PSA 0.70 09/12/2010   PSA 1.13 03/31/2009         Objective:    Vitals: There were no vitals taken for this visit.  There is no height or weight on file to calculate BMI.  Tobacco History  Smoking Status  . Former Smoker  Smokeless Tobacco  . Never Used    Comment: quit in the 80s     Counseling given: Not Answered   Past Medical History:  Diagnosis Date  . Diabetes mellitus    adult onset  . Dyslipidemia   . ED (erectile dysfunction)   . Hypogonadism male    Followup at the [place]  . Multiple sclerosis (HCC) 80s   final dx in the 90s[facility]  . Osteopenia 11/26/2012   Past Surgical History:  Procedure Laterality Date  . UMBILICAL HERNIA REPAIR     Family History  Problem Relation Age of Onset  . Coronary artery disease Mother     age of onset?  Marland Kitchen Hyperthyroidism Mother   . Diabetes Father     M and F (late onset)  . Gout Brother   . Stroke Neg Hx   . Colon cancer Neg Hx   . Prostate cancer Neg Hx    History  Sexual Activity  . Sexual activity: Not on file    Outpatient Encounter Prescriptions as of 02/01/2017  Medication Sig  . aspirin 81 MG tablet Take 81 mg by mouth daily. Reported on  01/24/2016  . Calcium Carbonate-Vitamin D (CALCIUM + D PO) Take 1 tablet by mouth daily. Reported on 04/18/2016  . collagenase (SANTYL) ointment Apply 1 application topically daily.  Marland Kitchen gabapentin (NEURONTIN) 800 MG tablet Take 800 mg by mouth 4 (four) times daily. Reported on 01/24/2016  . methylphenidate (METADATE CD) 10 MG CR capsule Take 10 mg by mouth 2 (two) times daily.  . methylphenidate (METADATE CD) 10 MG CR capsule Take 36 mg by mouth daily.  . Multiple Vitamin (MULTIVITAMIN) capsule Take 1 capsule by mouth daily. Reported on 04/18/2016  . mupirocin ointment (BACTROBAN) 2 % Place 1 application into the nose 2 (two) times daily.  Marland Kitchen MYRBETRIQ 50 MG TB24 tablet Take 50 mg by mouth daily.  Marland Kitchen RAPAFLO 8 MG CAPS capsule Take 8 mg by mouth daily.  . Teriflunomide 14 MG TABS Take 14 mg by mouth once.   No facility-administered encounter medications on file as of 02/01/2017.     Activities of Daily Living In your present state of health, do you have any difficulty performing the following activities: 10/01/2016  Hearing? N  Vision? N  Difficulty concentrating or making decisions? N  Walking or climbing stairs? Y  Dressing or bathing? Y  Doing errands, shopping? Y  Some  recent data might be hidden    Patient Care Team: Wanda Plump, MD as PCP - General Kathrynn Running. Lanae Boast, MD as Referring Physician (Gastroenterology) Jethro Bolus, MD as Consulting Physician (Urology)   Assessment:    Physical assessment deferred to PCP.  Exercise Activities and Dietary recommendations   Diet (meal preparation, eat out, water intake, caffeinated beverages, dairy products, fruits and vegetables): {Desc; diets:16563} Breakfast: Lunch:  Dinner:      Goals    None     Fall Risk Fall Risk  10/01/2016 01/24/2016 08/03/2015 08/05/2014 11/26/2012  Falls in the past year? Yes No No Yes Yes  Number falls in past yr: 2 or more - - 2 or more 2 or more  Injury with Fall? No - - - -  Risk Factor Category  - - -  High Fall Risk High Fall Risk  Risk for fall due to : - - - Impaired balance/gait;Impaired mobility -   Depression Screen PHQ 2/9 Scores 10/01/2016 01/24/2016 08/03/2015 08/05/2014  PHQ - 2 Score 0 0 0 0    Cognitive Function        Immunization History  Administered Date(s) Administered  . Influenza Split 09/20/2011  . Influenza Whole 09/24/2008, 09/22/2009, 09/12/2010  . Influenza, High Dose Seasonal PF 09/08/2015, 10/01/2016  . Influenza,inj,Quad PF,36+ Mos 10/24/2013  . Influenza-Unspecified 10/24/2014  . Pneumococcal Conjugate-13 08/05/2014  . Pneumococcal Polysaccharide-23 03/31/2009  . Td 08/06/2007  . Zoster 12/24/2014   Screening Tests Health Maintenance  Topic Date Due  . OPHTHALMOLOGY EXAM  07/25/2015  . FOOT EXAM  06/29/2016  . PNA vac Low Risk Adult (2 of 2 - PPSV23) 08/06/2019 (Originally 08/06/2015)  . HEMOGLOBIN A1C  04/01/2017  . TETANUS/TDAP  08/05/2017  . COLONOSCOPY  06/15/2020  . INFLUENZA VACCINE  Completed  . ZOSTAVAX  Completed  . Hepatitis C Screening  Completed      Plan:    During the course of the visit the patient was educated and counseled about the following appropriate screening and preventive services:   Vaccines to include Pneumoccal, Influenza, Hepatitis B, Td, Zostavax, HCV  Electrocardiogram  Cardiovascular Disease  Colorectal cancer screening  Diabetes screening  Prostate Cancer Screening  Glaucoma screening  Nutrition counseling   Smoking cessation counseling  Patient Instructions (the written plan) was given to the patient.    Mady Haagensen [place], [place]  01/30/2017

## 2017-02-01 ENCOUNTER — Ambulatory Visit: Payer: Medicare Other | Admitting: Internal Medicine

## 2017-02-01 ENCOUNTER — Telehealth: Payer: Self-pay | Admitting: Internal Medicine

## 2017-02-01 NOTE — Telephone Encounter (Signed)
Spouse cancelled 11:15am appointment today due to patient having difficulties, spouse states its hard for her to get him going in the morning and appointment [facility] to 02/08/17 at 2pm, charge or no charge

## 2017-02-01 NOTE — Telephone Encounter (Signed)
No charge, thx

## 2017-02-05 ENCOUNTER — Ambulatory Visit (INDEPENDENT_AMBULATORY_CARE_PROVIDER_SITE_OTHER): Payer: Medicare Other | Admitting: Internal Medicine

## 2017-02-05 ENCOUNTER — Encounter: Payer: Self-pay | Admitting: Internal Medicine

## 2017-02-05 VITALS — BP 128/70 | HR 87 | Temp 98.0°F | Resp 14 | Ht 69.0 in | Wt 192.1 lb

## 2017-02-05 DIAGNOSIS — M7989 Other specified soft tissue disorders: Secondary | ICD-10-CM

## 2017-02-05 DIAGNOSIS — R630 Anorexia: Secondary | ICD-10-CM | POA: Diagnosis not present

## 2017-02-05 DIAGNOSIS — Z09 Encounter for follow-up examination after completed treatment for conditions other than malignant neoplasm: Secondary | ICD-10-CM | POA: Diagnosis not present

## 2017-02-05 DIAGNOSIS — G35 Multiple sclerosis: Secondary | ICD-10-CM

## 2017-02-05 NOTE — Patient Instructions (Signed)
Call if the hand swelling returns   Front desk: Please cancel the visit he has with me this week, schedule a RTC with me in 6 months Okay to keep  the Medicare wellness visit

## 2017-02-05 NOTE — Progress Notes (Signed)
Pre visit review using our clinic review tool, if applicable. No additional management support is needed unless otherwise documented below in the visit note. 

## 2017-02-05 NOTE — Progress Notes (Signed)
Subjective:    Patient ID: Oscar Kern., male    DOB: Jan 02, 1951, 66 y.o.   MRN: [id number]  DOS:  02/05/2017 Type of visit - description : Here with his wife Interval history: 4 days ago,  Woke up w/ a swollen right hand. No pain, injury, fall. The hand was not hot or red. He started aspirin the next day and now hand  is back to normal.  Anorexia?  Also, wife is concerned about his appetite, ever since he started Metadate his diet habits have changed, he won't eat regular food, "he continue eating chips-snacks". His weight has not decrease per our scales.  Was seen a few months ago with nonhealing wound of the leg, that has improved under the care of [place].  Needs parking permit due to MS.  Wt Readings from Last 3 Encounters:  02/05/17 192 lb 2 oz (87.1 kg)  10/01/16 188 lb 3.2 oz (85.4 kg)  05/29/16 190 lb (86.2 kg)     Review of Systems  See above  Past Medical History:  Diagnosis Date  . Diabetes mellitus    adult onset  . Dyslipidemia   . ED (erectile dysfunction)   . Hypogonadism male    Followup at the [place]  . Multiple sclerosis (HCC) 80s   final [facility] in the 90s[facility]  . Osteopenia 11/26/2012    Past Surgical History:  Procedure Laterality Date  . UMBILICAL HERNIA REPAIR      Social History   Social History  . Marital status: Married    Spouse name: N/A  . Number of children: 1  . Years of education: N/A   Occupational History  . Retired Education officer, community    Social History Main Topics  . Smoking status: Former Games developer  . Smokeless tobacco: Never Used     Comment: quit in the 80s  . Alcohol use Yes     Comment: rarely has  wine   . Drug use: No  . Sexual activity: Not on file   Other Topics Concern  . Not on file   Social History Narrative   Lives w/ wife   Has a daughter, 3 G-kids      Allergies as of 02/05/2017   No Known Allergies     Medication List       Accurate as of 02/05/17  1:44 PM. Always use your  most recent med list.          aspirin 81 MG tablet Take 81 mg by mouth daily. Reported on 01/24/2016   AUBAGIO 14 MG Tabs Generic drug:  Teriflunomide Take 14 mg by mouth daily.   CALCIUM + D PO Take 1 tablet by mouth daily. Reported on 04/18/2016   gabapentin 800 MG tablet Commonly known as:  NEURONTIN Take 800 mg by mouth 4 (four) times daily. Reported on 01/24/2016   methylphenidate 36 MG CR tablet Commonly known as:  CONCERTA Take 36 mg by mouth 2 (two) times daily.   multivitamin capsule Take 1 capsule by mouth daily. Reported on 04/18/2016   MYRBETRIQ 50 MG Tb24 tablet Generic drug:  mirabegron ER Take 50 mg by mouth daily.   RAPAFLO 8 MG Caps capsule Generic drug:  silodosin Take 8 mg by mouth daily.   Vitamin D-3 1000 units Caps Take 4,000 Units by mouth daily.          Objective:   Physical Exam BP 128/70 (BP Location: Left Arm,  Patient Position: Sitting, Cuff Size: Small)   Pulse 87   Temp 98 F (36.7 C) (Oral)   Resp 14   Ht 5\' 9"  (1.753 m)   Wt 192 lb 2 oz (87.1 kg)   SpO2 96%   BMI 28.37 kg/m  General:   Well developed, well nourished . NAD.  HEENT:  Normocephalic . Face symmetric, atraumatic Lungs:  CTA B Normal respiratory effort, no intercostal retractions, no accessory muscle use. Heart: RRR,  no murmur.  No pretibial edema bilaterally  MSK: Hands and wrists of normal to inspection, palpation, no synovitis or swelling Skin: Pretibial areas are hyperpigmented but with no open sores. Neurologic:  alert & oriented X3.  Speech normal, otherwise exam is consistent with MS  Psych--  Cognition and judgment appear intact.  Cooperative with normal attention span and concentration.  Behavior appropriate. No anxious or depressed appearing.      Assessment & Plan:   Assessment: DM -- f/u by pcp Dyslipidemia Depression NEURO: --Multiple is sclerosis   Dx  80s, [facility] -- [place] -- s/c > 18 PT visits 2017 --Urinary  incontinence  Dr Jinny Sanders R leg pain-- [facility]ap[facility] at [facility] Osteopenia  Dx  by dexa  2013, T score -1.6.  DEXA-2017: T score -1.3, rx calcium-vitamin D Edema and stasis dermatitis  become a issue ~ 06-2015, [place] neg DVT Poor compliance   PLAN Hand swelling: Sxs are now resolved, etiology unclear, no history of gout. Recommend observation, let me know if not better Anorexia? He really has not depressed appetite, he is choosing not to eat regular food. Advised about healthy eating. MS: [facility]. Excoriation, left leg: Improved. In general, he is well taken care of the [place], patient would still like to come here from time to time. Next visit in 6 months. No need to do a CPX. Okay to keep his Medicare wellness visit. RTC 6 months. See instructions  F2F> 15

## 2017-02-06 NOTE — Assessment & Plan Note (Signed)
Hand swelling: Sxs are now resolved, etiology unclear, no history of gout. Recommend observation, let me know if not better Anorexia? He really has not depressed appetite, he is choosing not to eat regular food. Advised about healthy eating. MS: [facility]. Excoriation, left leg: Improved. In general, he is well taken care of the [place], patient would still like to come here from time to time. Next visit in 6 months. No need to do a CPX. Okay to keep his Medicare wellness visit. RTC 6 months. See instructions

## 2017-02-07 NOTE — Progress Notes (Deleted)
Pre visit review using our clinic review tool, if applicable. No additional management support is needed unless otherwise documented below in the visit note. 

## 2017-02-07 NOTE — Progress Notes (Deleted)
Subjective:   Oscar Matthews. is a 66 y.o. male who presents for Medicare Annual/Subsequent preventive examination.  Review of Systems:  No ROS.  Medicare Wellness Visit.   Sleep patterns: {SX; SLEEP PATTERNS:18802::"feels rested on waking","does not get up to void","gets up *** times nightly to void","sleeps *** hours nightly"}.   Home Safety/Smoke Alarms:   Living environment; residence and Firearm Safety: {Rehab home environment / accessibility:30080::"no firearms","firearms stored safely"}. Seat Belt Safety/Bike Helmet: Wears seat belt.   Counseling:   Eye Exam-  Dental-  Male:   CCS- Last 06/16/15: diverticulosis     PSA-  Lab Results  Component Value Date   PSA 1.12 08/05/2014   PSA 0.70 09/12/2010   PSA 1.13 03/31/2009        Objective:    Vitals: There were no vitals taken for this visit.  There is no height or weight on file to calculate BMI.  Tobacco History  Smoking Status  . Former Smoker  Smokeless Tobacco  . Never Used    Comment: quit in the 80s     Counseling given: Not Answered   Past Medical History:  Diagnosis Date  . Diabetes mellitus    adult onset  . Dyslipidemia   . ED (erectile dysfunction)   . Hypogonadism male    Followup at the [place]  . Multiple sclerosis (HCC) 80s   final dx in the 90s, [facility] ([facility])  . Osteopenia 11/26/2012   Past Surgical History:  Procedure Laterality Date  . UMBILICAL HERNIA REPAIR     Family History  Problem Relation Age of Onset  . Coronary artery disease Mother     age of onset?  Marland Kitchen Hyperthyroidism Mother   . Diabetes Father     M and F (late onset)  . Gout Brother   . Stroke Neg Hx   . Colon cancer Neg Hx   . Prostate cancer Neg Hx    History  Sexual Activity  . Sexual activity: Not on file    Outpatient Encounter Prescriptions as of 02/08/2017  Medication Sig  . aspirin 81 MG tablet Take 81 mg by mouth daily. Reported on 01/24/2016  . Calcium Carbonate-Vitamin D  (CALCIUM + D PO) Take 1 tablet by mouth daily. Reported on 04/18/2016  . Cholecalciferol (VITAMIN D-3) 1000 units CAPS Take 4,000 Units by mouth daily.  Marland Kitchen gabapentin (NEURONTIN) 800 MG tablet Take 800 mg by mouth 4 (four) times daily. Reported on 01/24/2016  . methylphenidate 36 MG PO CR tablet Take 36 mg by mouth 2 (two) times daily.  . Multiple Vitamin (MULTIVITAMIN) capsule Take 1 capsule by mouth daily. Reported on 04/18/2016  . MYRBETRIQ 50 MG TB24 tablet Take 50 mg by mouth daily.  Marland Kitchen RAPAFLO 8 MG CAPS capsule Take 8 mg by mouth daily.  . Teriflunomide (AUBAGIO) 14 MG TABS Take 14 mg by mouth daily.   No facility-administered encounter medications on file as of 02/08/2017.     Activities of Daily Living In your present state of health, do you have any difficulty performing the following activities: 10/01/2016  Hearing? N  Vision? N  Difficulty concentrating or making decisions? N  Walking or climbing stairs? Y  Dressing or bathing? Y  Doing errands, shopping? Y  Some recent data might be hidden    Patient Care Team: Wanda Plump, MD as PCP - General Kathrynn Running. Lanae Boast, MD as Referring Physician (Gastroenterology) Jethro Bolus, MD as Consulting Physician (  Urology)   Assessment:    Physical assessment deferred to PCP.  Exercise Activities and Dietary recommendations   Diet (meal preparation, eat out, water intake, caffeinated beverages, dairy products, fruits and vegetables): {Desc; diets:16563} Breakfast: Lunch:  Dinner:      Goals    None     Fall Risk Fall Risk  10/01/2016 01/24/2016 08/03/2015 08/05/2014 11/26/2012  Falls in the past year? Yes No No Yes Yes  Number falls in past yr: 2 or more - - 2 or more 2 or more  Injury with Fall? No - - - -  Risk Factor Category  - - - High Fall Risk High Fall Risk  Risk for fall due to : - - - Impaired balance/gait;Impaired mobility -   Depression Screen PHQ 2/9 Scores 10/01/2016 01/24/2016 08/03/2015 08/05/2014  PHQ - 2 Score 0 0  0 0    Cognitive Function        Immunization History  Administered Date(s) Administered  . Influenza Split 09/20/2011  . Influenza Whole 09/24/2008, 09/22/2009, 09/12/2010  . Influenza, High Dose Seasonal PF 09/08/2015, 10/01/2016  . Influenza,inj,Quad PF,36+ Mos 10/24/2013  . Influenza-Unspecified 10/24/2014  . Pneumococcal Conjugate-13 08/05/2014  . Pneumococcal Polysaccharide-23 03/31/2009  . Td 08/06/2007  . Zoster 12/24/2014   Screening Tests Health Maintenance  Topic Date Due  . OPHTHALMOLOGY EXAM  07/25/2015  . FOOT EXAM  06/29/2016  . PNA vac Low Risk Adult (2 of 2 - PPSV23) 08/06/2019 (Originally 08/06/2015)  . HEMOGLOBIN A1C  04/01/2017  . TETANUS/TDAP  08/05/2017  . COLONOSCOPY  06/15/2020  . INFLUENZA VACCINE  Completed  . ZOSTAVAX  Completed  . Hepatitis C Screening  Completed      Plan:    During the course of the visit the patient was educated and counseled about the following appropriate screening and preventive services:   Vaccines to include Pneumoccal, Influenza, Hepatitis B, Td, Zostavax, HCV  Electrocardiogram  Cardiovascular Disease  Colorectal cancer screening  Diabetes screening  Prostate Cancer Screening  Glaucoma screening  Nutrition counseling   Smoking cessation counseling  Patient Instructions (the written plan) was given to the patient.    Avon Gully, [place]  02/07/2017

## 2017-02-08 ENCOUNTER — Ambulatory Visit: Payer: Medicare Other | Admitting: Internal Medicine

## 2017-02-08 ENCOUNTER — Ambulatory Visit: Payer: Medicare Other | Admitting: *Deleted

## 2017-02-11 NOTE — Progress Notes (Addendum)
Subjective:   Oscar Matthews. is a 66 y.o. male who presents with wife for Medicare Annual/Subsequent preventive examination.  Pt states his right hand is still swollen and he has had difficulty gripping objects. Wife has bought eating aids to assist but they are still concerned. [facility] but [facility] will not [facility] with his right hand.   Review of Systems:  No ROS.  Medicare Wellness Visit.  Cardiac Risk Factors include: advanced age (>32men, >33 women);diabetes mellitus;dyslipidemia;male gender Sleep patterns:   Pt states he sleeps good, 6 hours per night. Does not get up to use restroom. Takes a nap during the day. Home Safety/Smoke Alarms: Fire alarms. Feels safe. Living environment; residence and Firearm Safety: No firearms. One story home.  Seat Belt Safety/Bike Helmet: Wears seat belt.   Counseling:   [facility] Ex[facility]- [facility]- Dent[facility] every 6 months.   Male:   CCS-  Last 06/16/15: No result on file. Recall 5 years per external report. PSA-  Lab Results  Component Value Date   PSA 1.12 08/05/2014   PSA 0.70 09/12/2010   PSA 1.13 03/31/2009        Objective:    Vitals: BP 126/82 (BP Location: Left Arm, Patient Position: Sitting, Cuff Size: Normal)   Pulse 78   SpO2 98%   There is no height or weight on file to calculate BMI.  Tobacco History  Smoking Status  . Former Smoker  Smokeless Tobacco  . Never Used    Comment: quit in the 80s     Counseling given: Not Answered   Past Medical History:  Diagnosis Date  . Diabetes mellitus    adult onset  . Dyslipidemia   . ED (erectile dysfunction)   . Hypogonadism male    Followup at the [place]  . Incontinence   . Multiple sclerosis (HCC) 80s   final dx in the 90s, [facility] ([facility] a [facility])  . Osteopenia 11/26/2012   Past Surgical History:  Procedure Laterality Date  . UMBILICAL HERNIA REPAIR     Family History  Problem Relation Age of Onset  . Coronary artery disease  Mother     age of onset?  Marland Kitchen Hyperthyroidism Mother   . Diabetes Father     M and F (late onset)  . Gout Brother   . Stroke Neg Hx   . Colon cancer Neg Hx   . Prostate cancer Neg Hx    History  Sexual Activity  . Sexual activity: No    Outpatient Encounter Prescriptions as of 02/13/2017  Medication Sig  . aspirin 81 MG tablet Take 81 mg by mouth daily. Reported on 01/24/2016  . Calcium Carbonate-Vitamin D (CALCIUM + D PO) Take 1 tablet by mouth daily. Reported on 04/18/2016  . Cholecalciferol (VITAMIN D-3) 1000 units CAPS Take 4,000 Units by mouth daily.  Marland Kitchen gabapentin (NEURONTIN) 800 MG tablet Take 800 mg by mouth 4 (four) times daily. Reported on 01/24/2016  . methylphenidate 36 MG PO CR tablet Take 36 mg by mouth 2 (two) times daily.  . Multiple Vitamin (MULTIVITAMIN) capsule Take 1 capsule by mouth daily. Reported on 04/18/2016  . MYRBETRIQ 50 MG TB24 tablet Take 50 mg by mouth daily.  Marland Kitchen RAPAFLO 8 MG CAPS capsule Take 8 mg by mouth daily.  . Teriflunomide (AUBAGIO) 14 MG TABS Take 14 mg by mouth daily.   No facility-administered encounter medications on file as of 02/13/2017.  Activities of Daily Living In your present state of health, do you have any difficulty performing the following activities: 02/13/2017 10/01/2016  Hearing? N N  Vision? N N  Difficulty concentrating or making decisions? Y N  Walking or climbing stairs? Y Y  Dressing or bathing? Y Y  Doing errands, shopping? Malvin Johns  Preparing Food and eating ? N -  Using the Toilet? N -  In the past six months, have you accidently leaked urine? Y -  Do you have problems with loss of bowel control? Y -  Managing your Medications? N -  Managing your Finances? N -  Housekeeping or managing your Housekeeping? N -  Some recent data might be hidden    Patient Care Team: Wanda Plump, MD as PCP - General Kathrynn Running. Lanae Boast, MD as Referring Physician (Gastroenterology) Jethro Bolus, MD as Consulting Physician (Urology)     Assessment:    Physical assessment deferred to PCP.  Exercise Activities and Dietary recommendations Current Exercise Habits: Home exercise routine (PT at home), Frequency (Times/Week): 2 Diet (meal preparation, eat out, water intake, caffeinated beverages, dairy products, fruits and vegetables):  Breakfast: Tomasa Blase and eggs. 2 cups coffee. Lunch: Take out [employer] or sandwich. Ginger ale or sprite. Dinner: [employer], Risk manager or other [employer].  Does not drink water.   Goals    . Go on a cruise      Fall Risk Fall Risk  02/13/2017 10/01/2016 01/24/2016 08/03/2015 08/05/2014  Falls in the past year? Yes Yes No No Yes  Number falls in past yr: 2 or more 2 or more - - 2 or more  Injury with Fall? - No - - -  Risk Factor Category  High Fall Risk - - - High Fall Risk  Risk for fall due to : - - - - Impaired balance/gait;Impaired mobility   Depression Screen PHQ 2/9 Scores 02/13/2017 10/01/2016 01/24/2016 08/03/2015  PHQ - 2 Score 0 0 0 0    Cognitive Function MMSE - Mini Mental State Exam 02/13/2017  Orientation to time 2  Orientation to Place 5  Registration 3  Attention/ Calculation 5  Recall 2  Language- name 2 objects 2  Language- repeat 1  Language- follow 3 step command 3  Language- read & follow direction 1  Write a sentence 0  Copy design 0  Total score 24        Immunization History  Administered Date(s) Administered  . Influenza Split 09/20/2011  . Influenza Whole 09/24/2008, 09/22/2009, 09/12/2010  . Influenza, High Dose Seasonal PF 09/08/2015, 10/01/2016  . Influenza,inj,Quad PF,36+ Mos 10/24/2013  . Influenza-Unspecified 10/24/2014  . Pneumococcal Conjugate-13 08/05/2014  . Pneumococcal Polysaccharide-23 03/31/2009  . Td 08/06/2007  . Zoster 12/24/2014   Screening Tests Health Maintenance  Topic Date Due  . OPHTHALMOLOGY EXAM  07/25/2015  . FOOT EXAM  06/29/2016  . PNA vac Low Risk Adult (2 of 2 - PPSV23) 08/06/2019 (Originally 08/06/2015)  . HEMOGLOBIN A1C   04/01/2017  . TETANUS/TDAP  08/05/2017  . COLONOSCOPY  06/15/2020  . INFLUENZA VACCINE  Completed  . Hepatitis C Screening  Completed      Plan:      During the course of the visit the patient was educated and counseled about the following appropriate screening and preventive services:   Vaccines to include Pneumoccal, Influenza, Hepatitis B, Td, Zostavax, HCV  Cardiovascular Disease  Colorectal cancer screening  Diabetes screening  Prostate Cancer Screening  Glaucoma screening  Nutrition counseling  Patient Instructions (the written plan) was given to the patient.    Richelle Ito, RN  02/13/2017  Willow Ora, MD

## 2017-02-11 NOTE — Progress Notes (Signed)
Pre visit review using our clinic review tool, if applicable. No additional management support is needed unless otherwise documented below in the visit note. 

## 2017-02-13 ENCOUNTER — Encounter: Payer: Self-pay | Admitting: *Deleted

## 2017-02-13 ENCOUNTER — Ambulatory Visit (INDEPENDENT_AMBULATORY_CARE_PROVIDER_SITE_OTHER): Payer: Medicare Other | Admitting: *Deleted

## 2017-02-13 VITALS — BP 126/82 | HR 78 | Resp 16 | Ht 66.0 in | Wt 209.0 lb

## 2017-02-13 DIAGNOSIS — Z0000 Encounter for general adult medical examination without abnormal findings: Secondary | ICD-10-CM

## 2017-02-13 NOTE — Patient Instructions (Signed)
 Fall Prevention in the Home Introduction Falls can cause injuries. They can happen to people of all ages. There are many things you can do to make your home safe and to help prevent falls. What can I do on the outside of my home?  Regularly fix the edges of walkways and driveways and fix any cracks.  Remove anything that might make you trip as you walk through a door, such as a raised step or threshold.  Trim any bushes or trees on the path to your home.  Use bright outdoor lighting.  Clear any walking paths of anything that might make someone trip, such as rocks or tools.  Regularly check to see if handrails are loose or broken. Make sure that both sides of any steps have handrails.  Any raised decks and porches should have guardrails on the edges.  Have any leaves, snow, or ice cleared regularly.  Use sand or salt on walking paths during winter.  Clean up any spills in your garage right away. This includes oil or grease spills. What can I do in the bathroom?  Use night lights.  Install grab bars by the toilet and in the tub and shower. Do not use towel bars as grab bars.  Use non-skid mats or decals in the tub or shower.  If you need to sit down in the shower, use a plastic, non-slip stool.  Keep the floor dry. Clean up any water that spills on the floor as soon as it happens.  Remove soap buildup in the tub or shower regularly.  Attach bath mats securely with double-sided non-slip rug tape.  Do not have throw rugs and other things on the floor that can make you trip. What can I do in the bedroom?  Use night lights.  Make sure that you have a light by your bed that is easy to reach.  Do not use any sheets or blankets that are too big for your bed. They should not hang down onto the floor.  Have a firm chair that has side arms. You can use this for support while you get dressed.  Do not have throw rugs and other things on the floor that can make you trip. What  can I do in the kitchen?  Clean up any spills right away.  Avoid walking on wet floors.  Keep items that you use a lot in easy-to-reach places.  If you need to reach something above you, use a strong step stool that has a grab bar.  Keep electrical cords out of the way.  Do not use floor polish or wax that makes floors slippery. If you must use wax, use non-skid floor wax.  Do not have throw rugs and other things on the floor that can make you trip. What can I do with my stairs?  Do not leave any items on the stairs.  Make sure that there are handrails on both sides of the stairs and use them. Fix handrails that are broken or loose. Make sure that handrails are as long as the stairways.  Check any carpeting to make sure that it is firmly attached to the stairs. Fix any carpet that is loose or worn.  Avoid having throw rugs at the top or bottom of the stairs. If you do have throw rugs, attach them to the floor with carpet tape.  Make sure that you have a light switch at the top of the stairs and the bottom of the stairs. If   you do not have them, ask someone to add them for you. What else can I do to help prevent falls?  Wear shoes that:  Do not have high heels.  Have rubber bottoms.  Are comfortable and fit you well.  Are closed at the toe. Do not wear sandals.  If you use a stepladder:  Make sure that it is fully opened. Do not climb a closed stepladder.  Make sure that both sides of the stepladder are locked into place.  Ask someone to hold it for you, if possible.  Clearly mark and make sure that you can see:  Any grab bars or handrails.  First and last steps.  Where the edge of each step is.  Use tools that help you move around (mobility aids) if they are needed. These include:  Canes.  Walkers.  Scooters.  Crutches.  Turn on the lights when you go into a dark area. Replace any light bulbs as soon as they burn out.  Set up your furniture so you  have a clear path. Avoid moving your furniture around.  If any of your floors are uneven, fix them.  If there are any pets around you, be aware of where they are.  Review your medicines with your doctor. Some medicines can make you feel dizzy. This can increase your chance of falling. Ask your doctor what other things that you can do to help prevent falls. This information is not intended to replace advice given to you by your health care provider. Make sure you discuss any questions you have with your health care provider. Document Released: 10/06/2009 Document Revised: 05/17/2016 Document Reviewed: 01/14/2015  2017 Elsevier  Health Maintenance, Male A healthy lifestyle and preventative care can promote health and wellness.  Maintain regular health, dental, and eye exams.  Eat a healthy diet. Foods like vegetables, fruits, whole grains, low-fat dairy products, and lean protein foods contain the nutrients you need and are low in calories. Decrease your intake of foods high in solid fats, added sugars, and salt. Get information about a proper diet from your health care provider, if necessary.  Regular physical exercise is one of the most important things you can do for your health. Most adults should get at least 150 minutes of moderate-intensity exercise (any activity that increases your heart rate and causes you to sweat) each week. In addition, most adults need muscle-strengthening exercises on 2 or more days a week.   Maintain a healthy weight. The body mass index (BMI) is a screening tool to identify possible weight problems. It provides an estimate of body fat based on height and weight. Your health care provider can find your BMI and can help you achieve or maintain a healthy weight. For males 20 years and older:  A BMI below 18.5 is considered underweight.  A BMI of 18.5 to 24.9 is normal.  A BMI of 25 to 29.9 is considered overweight.  A BMI of 30 and above is considered  obese.  Maintain normal blood lipids and cholesterol by exercising and minimizing your intake of saturated fat. Eat a balanced diet with plenty of fruits and vegetables. Blood tests for lipids and cholesterol should begin at age 20 and be repeated every 5 years. If your lipid or cholesterol levels are high, you are over age 50, or you are at high risk for heart disease, you may need your cholesterol levels checked more frequently.Ongoing high lipid and cholesterol levels should be treated with medicines if diet   and exercise are not working.  If you smoke, find out from your health care provider how to quit. If you do not use tobacco, do not start.  Lung cancer screening is recommended for adults aged 55-80 years who are at high risk for developing lung cancer because of a history of smoking. A yearly low-dose CT scan of the lungs is recommended for people who have at least a 30-pack-year history of smoking and are current smokers or have quit within the past 15 years. A pack year of smoking is smoking an average of 1 pack of cigarettes a day for 1 year (for example, a 30-pack-year history of smoking could mean smoking 1 pack a day for 30 years or 2 packs a day for 15 years). Yearly screening should continue until the smoker has stopped smoking for at least 15 years. Yearly screening should be stopped for people who develop a health problem that would prevent them from having lung cancer treatment.  If you choose to drink alcohol, do not have more than 2 drinks per day. One drink is considered to be 12 oz (360 mL) of beer, 5 oz (150 mL) of wine, or 1.5 oz (45 mL) of liquor.  Avoid the use of street drugs. Do not share needles with anyone. Ask for help if you need support or instructions about stopping the use of drugs.  High blood pressure causes heart disease and increases the risk of stroke. High blood pressure is more likely to develop in:  People who have blood pressure in the end of the normal  range (100-139/85-89 mm Hg).  People who are overweight or obese.  People who are African American.  If you are 18-39 years of age, have your blood pressure checked every 3-5 years. If you are 40 years of age or older, have your blood pressure checked every year. You should have your blood pressure measured twice-once when you are at a hospital or clinic, and once when you are not at a hospital or clinic. Record the average of the two measurements. To check your blood pressure when you are not at a hospital or clinic, you can use:  An automated blood pressure machine at a pharmacy.  A home blood pressure monitor.  If you are 45-79 years old, ask your health care provider if you should take aspirin to prevent heart disease.  Diabetes screening involves taking a blood sample to check your fasting blood sugar level. This should be done once every 3 years after age 45 if you are at a normal weight and without risk factors for diabetes. Testing should be considered at a younger age or be carried out more frequently if you are overweight and have at least 1 risk factor for diabetes.  Colorectal cancer can be detected and often prevented. Most routine colorectal cancer screening begins at the age of 50 and continues through age 75. However, your health care provider may recommend screening at an earlier age if you have risk factors for colon cancer. On a yearly basis, your health care provider may provide home test kits to check for hidden blood in the stool. A small camera at the end of a tube may be used to directly examine the colon (sigmoidoscopy or colonoscopy) to detect the earliest forms of colorectal cancer. Talk to your health care provider about this at age 50 when routine screening begins. A direct exam of the colon should be repeated every 5-10 years through age 75, unless early forms   of precancerous polyps or small growths are found.  People who are at an increased risk for hepatitis B should  be screened for this virus. You are considered at high risk for hepatitis B if:  You were born in a country where hepatitis B occurs often. Talk with your health care provider about which countries are considered high risk.  Your parents were born in a high-risk country and you have not received a shot to protect against hepatitis B (hepatitis B vaccine).  You have HIV or AIDS.  You use needles to inject street drugs.  You live with, or have sex with, someone who has hepatitis B.  You are a man who has sex with other men (MSM).  You get hemodialysis treatment.  You take certain medicines for conditions like cancer, organ transplantation, and autoimmune conditions.  Hepatitis C blood testing is recommended for all people born from 1945 through 1965 and any individual with known risk factors for hepatitis C.  Healthy men should no longer receive prostate-specific antigen (PSA) blood tests as part of routine cancer screening. Talk to your health care provider about prostate cancer screening.  Testicular cancer screening is not recommended for adolescents or adult males who have no symptoms. Screening includes self-exam, a health care provider exam, and other screening tests. Consult with your health care provider about any symptoms you have or any concerns you have about testicular cancer.  Practice safe sex. Use condoms and avoid high-risk sexual practices to reduce the spread of sexually transmitted infections (STIs).  You should be screened for STIs, including gonorrhea and chlamydia if:  You are sexually active and are younger than 24 years.  You are older than 24 years, and your health care provider tells you that you are at risk for this type of infection.  Your sexual activity has changed since you were last screened, and you are at an increased risk for chlamydia or gonorrhea. Ask your health care provider if you are at risk.  If you are at risk of being infected with HIV, it  is recommended that you take a prescription medicine daily to prevent HIV infection. This is called pre-exposure prophylaxis (PrEP). You are considered at risk if:  You are a man who has sex with other men (MSM).  You are a heterosexual man who is sexually active with multiple partners.  You take drugs by injection.  You are sexually active with a partner who has HIV.  Talk with your health care provider about whether you are at high risk of being infected with HIV. If you choose to begin PrEP, you should first be tested for HIV. You should then be tested every 3 months for as long as you are taking PrEP.  Use sunscreen. Apply sunscreen liberally and repeatedly throughout the day. You should seek shade when your shadow is shorter than you. Protect yourself by wearing long sleeves, pants, a wide-brimmed hat, and sunglasses year round whenever you are outdoors.  Tell your health care provider of new moles or changes in moles, especially if there is a change in shape or color. Also, tell your health care provider if a mole is larger than the size of a pencil eraser.  A one-time screening for abdominal aortic aneurysm (AAA) and surgical repair of large AAAs by ultrasound is recommended for men aged 65-75 years who are current or former smokers.  Stay current with your vaccines (immunizations). This information is not intended to replace advice given to you by   your health care provider. Make sure you discuss any questions you have with your health care provider. Document Released: 06/07/2008 Document Revised: 12/31/2014 Document Reviewed: 09/13/2015 Elsevier Interactive Patient Education  2017 Elsevier Inc.  

## 2017-08-05 ENCOUNTER — Ambulatory Visit: Payer: Medicare Other | Admitting: Internal Medicine

## 2017-08-05 ENCOUNTER — Telehealth: Payer: Self-pay | Admitting: Internal Medicine

## 2017-08-05 DIAGNOSIS — Z0289 Encounter for other administrative examinations: Secondary | ICD-10-CM

## 2017-08-05 NOTE — Telephone Encounter (Signed)
Noted, no charge.  

## 2017-08-05 NOTE — Telephone Encounter (Signed)
Pt's spouse called in at 11:19 to reschedule apt, she said that pt is unable to come by himself and she don't feel well enough to bring him in. Pt has been reschedule to Friday.

## 2017-08-09 ENCOUNTER — Telehealth: Payer: Self-pay | Admitting: Internal Medicine

## 2017-08-09 ENCOUNTER — Ambulatory Visit: Payer: Medicare Other | Admitting: Internal Medicine

## 2017-08-09 NOTE — Telephone Encounter (Signed)
Spouse called in to make provider aware that they are running behind. Advised by assistant to reschedule apt. Pt's spouse says that they will call back in to reschedule.

## 2017-08-09 NOTE — Telephone Encounter (Signed)
Okay, no charge 

## 2017-08-29 ENCOUNTER — Ambulatory Visit (INDEPENDENT_AMBULATORY_CARE_PROVIDER_SITE_OTHER): Payer: Medicare Other | Admitting: Internal Medicine

## 2017-08-29 ENCOUNTER — Encounter: Payer: Self-pay | Admitting: Internal Medicine

## 2017-08-29 VITALS — BP 128/62 | HR 74 | Temp 98.2°F | Resp 14 | Ht 66.0 in

## 2017-08-29 DIAGNOSIS — G35 Multiple sclerosis: Secondary | ICD-10-CM | POA: Diagnosis not present

## 2017-08-29 DIAGNOSIS — E119 Type 2 diabetes mellitus without complications: Secondary | ICD-10-CM | POA: Diagnosis not present

## 2017-08-29 NOTE — Patient Instructions (Signed)
GO TO THE LAB : Get the blood work     GO TO THE FRONT DESK Schedule your next appointment for a  routine checkup in 6 months  

## 2017-08-29 NOTE — Progress Notes (Signed)
Pre visit review using our clinic review tool, if applicable. No additional management support is needed unless otherwise documented below in the visit note. 

## 2017-08-29 NOTE — Progress Notes (Signed)
Subjective:    Patient ID: Oscar Kern., male    DOB: [birth year], 66 y.o.   MRN: [id number]  DOS:  08/29/2017 Type of visit - description : rov Interval history: Here with his wife. The patient's main concern is atrophy of the right hand, going on for a while. He already address that with neurology and apparently saw physical therapy, they recommended some exercises. He was told that the problem was probably related to MS. Other than that he feels at baseline. Reports good compliance with medication. Last visit at the [place] about 6 months ago, labs were done   Review of Systems Denies neck pain  Past Medical History:  Diagnosis Date  . Diabetes mellitus    adult onset  . Dyslipidemia   . ED (erectile dysfunction)   . Hypogonadism male    Followup at the [place]  . Incontinence   . Multiple sclerosis (HCC) 80s   final dx in the 90s, [facility] ([facility])  . Osteopenia 11/26/2012    Past Surgical History:  Procedure Laterality Date  . UMBILICAL HERNIA REPAIR      Social History   Social History  . Marital status: Married    Spouse name: N/A  . Number of children: 1  . Years of education: N/A   Occupational History  . Retired Education officer, community    Social History Main Topics  . Smoking status: Former Games developer  . Smokeless tobacco: Never Used     Comment: quit in the 80s  . Alcohol use Yes     Comment: rarely has  wine   . Drug use: No  . Sexual activity: No   Other Topics Concern  . Not on file   Social History Narrative   Lives w/ wife   Has a daughter, 3 G-kids      Allergies as of 08/29/2017   No Known Allergies     Medication List       Accurate as of 08/29/17  3:53 PM. Always use your most recent med list.          aspirin 81 MG tablet Take 81 mg by mouth daily. Reported on 01/24/2016   atorvastatin 20 MG tablet Commonly known as:  LIPITOR Take 20 mg by mouth daily.   AUBAGIO 14 MG Tabs Generic drug:  Teriflunomide Take 14 mg by mouth  daily.   CALCIUM + D PO Take 1 tablet by mouth daily. Reported on 04/18/2016   Castellani Paint 1.5 % Liqd Apply 1 application topically 2 (two) times daily.   furosemide 40 MG tablet Commonly known as:  LASIX Take 40 mg by mouth daily.   gabapentin 800 MG tablet Commonly known as:  NEURONTIN Take 800 mg by mouth 4 (four) times daily. Reported on 01/24/2016   methylphenidate 36 MG CR tablet Commonly known as:  CONCERTA Take 36 mg by mouth 2 (two) times daily.   multivitamin capsule Take 1 capsule by mouth daily. Reported on 04/18/2016   MYRBETRIQ 50 MG Tb24 tablet Generic drug:  mirabegron ER Take 50 mg by mouth daily.   potassium chloride SA 20 MEQ tablet Commonly known as:  K-DUR,KLOR-CON Take 20 mEq by mouth daily.   RAPAFLO 8 MG Caps capsule Generic drug:  silodosin Take 8 mg by mouth daily.   Vitamin D-3 1000 units Caps Take 4,000 Units by mouth daily.          Objective:   Physical Exam BP 128/62 (  BP Location: Left Arm, Patient Position: Sitting, Cuff Size: Small)   Pulse 74   Temp 98.2 F (36.8 C) (Oral)   Resp 14   Ht  (1.676 m)   SpO2 97%  General:   Sits in the wheelchair, no physical or emotional distress .  HEENT:  Normocephalic . Face symmetric, atraumatic Lungs:  CTA B Normal respiratory effort, no intercostal retractions, no accessory muscle use. Heart: RRR,  no murmur.  Neurologic:  alert & oriented X3.  Speech normal, gait not tested, he is wheelchair bound. Motor strength: Decrease at the distal right upper extremity and proximal left upper extremity. Quite decreased at the neck. Psych--  Cognition and judgment appear intact.  Cooperative with normal attention span and concentration.  Behavior appropriate. No anxious or depressed appearing.      Assessment & Plan:   Assessment: DM -- f/u by pcp Dyslipidemia Depression NEURO: --Multiple is sclerosis   Dx  80s, f/[facility]y ( [facility]) -- [place] -- s/c > 18 PT visits  2017 --Urinary incontinence  Dr Jinny Sanders R leg pain-- on gabapentin per [facility]u at the [facility] Osteopenia  Dx  by dexa  2013, T score -1.6.  DEXA-2017: T score -1.3, rx calcium-vitamin D Edema and stasis dermatitis  become a issue ~ 06-2015, [place] neg DVT Poor compliance   PLAN DM: Diet control, check a CMP and A1c. Multiple sclerosis: The patient is quite concerned about R hand atrophy, has  discussed issue w/ neurology -PT @  the [place], they RX some exercises. The wife is quite frustrated about that, b/c he "is not improving" . I candidly discuss with them that I don't  anticipate recovery but he could keep what he has or delay deterioration by exercising and following PT recommendations; also rec ongoing discussion with neurology regards his MS. RTC 6 months

## 2017-08-30 LAB — COMPREHENSIVE METABOLIC PANEL
ALK PHOS: 48 U/L (ref 39–117)
ALT: 19 U/L (ref 0–53)
AST: 20 U/L (ref 0–37)
Albumin: 3.9 g/dL (ref 3.5–5.2)
BILIRUBIN TOTAL: 0.6 mg/dL (ref 0.2–1.2)
BUN: 15 mg/dL (ref 6–23)
CO2: 28 mEq/L (ref 19–32)
Calcium: 9.4 mg/dL (ref 8.4–10.5)
Chloride: 103 mEq/L (ref 96–112)
Creatinine, Ser: 0.81 mg/dL (ref 0.40–1.50)
GFR: 121.82 mL/min (ref >60.00)
GLUCOSE: 91 mg/dL (ref 70–99)
POTASSIUM: 4.1 meq/L (ref 3.5–5.1)
SODIUM: 138 meq/L (ref 135–145)
TOTAL PROTEIN: 6.8 g/dL (ref 6.0–8.3)

## 2017-08-30 LAB — HEMOGLOBIN A1C: HEMOGLOBIN A1C: 5.9 % (ref 4.6–6.5)

## 2017-08-31 NOTE — Assessment & Plan Note (Signed)
DM: Diet control, check a CMP and A1c. Multiple sclerosis: The patient is quite concerned about R hand atrophy, has  discussed issue w/ neurology -PT @  the [place], they RX some exercises. The wife is quite frustrated about that, b/c he "is not improving" . I candidly discuss with them that I don't  anticipate recovery but he could keep what he has or delay deterioration by exercising and following PT recommendations; also rec ongoing discussion with neurology regards his MS. RTC 6 months

## 2018-02-26 ENCOUNTER — Encounter: Payer: Self-pay | Admitting: Internal Medicine

## 2018-02-26 ENCOUNTER — Ambulatory Visit (INDEPENDENT_AMBULATORY_CARE_PROVIDER_SITE_OTHER): Payer: Medicare Other | Admitting: Internal Medicine

## 2018-02-26 VITALS — BP 128/78 | HR 64 | Temp 97.6°F | Resp 14 | Ht 66.0 in | Wt 198.1 lb

## 2018-02-26 DIAGNOSIS — E119 Type 2 diabetes mellitus without complications: Secondary | ICD-10-CM

## 2018-02-26 DIAGNOSIS — Z125 Encounter for screening for malignant neoplasm of prostate: Secondary | ICD-10-CM | POA: Diagnosis not present

## 2018-02-26 DIAGNOSIS — E785 Hyperlipidemia, unspecified: Secondary | ICD-10-CM

## 2018-02-26 DIAGNOSIS — G35 Multiple sclerosis: Secondary | ICD-10-CM

## 2018-02-26 NOTE — Progress Notes (Signed)
Pre visit review using our clinic review tool, if applicable. No additional management support is needed unless otherwise documented below in the visit note. 

## 2018-02-26 NOTE — Progress Notes (Signed)
Subjective:    Patient ID: Oscar Kern., male    DOB: 1951/12/14, 67 y.o.   MRN: [id number]  DOS:  02/26/2018 Type of visit - description : f/u Interval history: Since the last visit, he has seen a number of providers at the [place]. His main concern continues to be the right hand atrophy and deformity Good med compliance Denies depression. Pt's wife is quite concerned about the prostate cancer screening Wife is here,  noted to be depressed and exhausted, likely  caregiver fatigue   Review of Systems   Past Medical History:  Diagnosis Date  . Diabetes mellitus    adult onset  . Dyslipidemia   . ED (erectile dysfunction)   . Hypogonadism male    Followup at the [place]  . Incontinence   . Multiple sclerosis (HCC) 80s   final dx in the 90s[facility])  . Osteopenia 11/26/2012    Past Surgical History:  Procedure Laterality Date  . UMBILICAL HERNIA REPAIR      Social History   Socioeconomic History  . Marital status: Married    Spouse name: Not on file  . Number of children: 1  . Years of education: Not on file  . Highest education level: Not on file  Social Needs  . Financial resource strain: Not on file  . Food insecurity - worry: Not on file  . Food insecurity - inability: Not on file  . Transportation needs - medical: Not on file  . Transportation needs - non-medical: Not on file  Occupational History  . Occupation: Retired Education officer, community  Tobacco Use  . Smoking status: Former Games developer  . Smokeless tobacco: Never Used  . Tobacco comment: quit in the 80s  Substance and Sexual Activity  . Alcohol use: Yes    Comment: rarely has  wine   . Drug use: No  . Sexual activity: No  Other Topics Concern  . Not on file  Social History Narrative   Lives w/ wife   Has a daughter, 3 G-kids      Allergies as of 02/26/2018   No Known Allergies     Medication List        Accurate as of 02/26/18 11:59 PM. Always use your most recent med list.          acetaminophen 500 MG tablet Commonly known as:  TYLENOL Take 500 mg by mouth every 6 (six) hours as needed.   aspirin 81 MG tablet Take 81 mg by mouth daily. Reported on 01/24/2016   atorvastatin 20 MG tablet Commonly known as:  LIPITOR Take 20 mg by mouth daily.   AUBAGIO 14 MG Tabs Generic drug:  Teriflunomide Take 14 mg by mouth daily.   CALCIUM + D PO Take 1 tablet by mouth daily. Reported on 04/18/2016   Castellani Paint 1.5 % Liqd Apply 1 application topically 2 (two) times daily.   furosemide 40 MG tablet Commonly known as:  LASIX Take 80 mg by mouth daily.   gabapentin 800 MG tablet Commonly known as:  NEURONTIN Take 800 mg by mouth 3 (three) times daily. Reported on 01/24/2016   methylphenidate 36 MG CR tablet Commonly known as:  CONCERTA Take 36 mg by mouth 2 (two) times daily.   multivitamin capsule Take 1 capsule by mouth daily. Reported on 04/18/2016   MYRBETRIQ 50 MG Tb24 tablet Generic drug:  mirabegron ER Take 50 mg by mouth daily.   potassium chloride  SA 20 MEQ tablet Commonly known as:  K-DUR,KLOR-CON Take 20 mEq by mouth daily.   RAPAFLO 8 MG Caps capsule Generic drug:  silodosin Take 8 mg by mouth daily with breakfast.   vitamin C 500 MG tablet Commonly known as:  ASCORBIC ACID Take 500 mg by mouth daily.   Vitamin D-3 1000 units Caps Take 4,000 Units by mouth daily.          Objective:   Physical Exam BP 128/78 (BP Location: Left Arm, Patient Position: Sitting, Cuff Size: Normal)   Pulse 64   Temp 97.6 F (36.4 C) (Oral)   Resp 14   Ht 5\' 6"  (1.676 m)   Wt 198 lb 2 oz (89.9 kg)   SpO2 96%   BMI 31.98 kg/m  General:   Well developed, sitting in a wheelchair, in no distress. Neck: poor muscle tone. HEENT:  Normocephalic . Face symmetric, atraumatic Lungs:  CTA B Normal respiratory effort, no intercostal retractions, no accessory muscle use. Heart: RRR,  no murmur.  Skin: Not pale. Not jaundice Neurologic:  alert  & oriented X3.  Speech normal, gait not tested  Psych--  Cognition and judgment appear intact.  Cooperative with normal attention span and concentration.  Behavior appropriate. No anxious or depressed appearing.      Assessment & Plan:   Assessment: DM -- f/u by pcp Dyslipidemia Depression NEURO: --Multiple is sclerosis   Dx  80s, [facility] -- [place] -- s/c > 18 PT visits 2017 --Urinary incontinence  Dr Jinny Sanders R leg pain-- [facility] Osteopenia  Dx  by dexa  2013, T score -1.6.  DEXA-2017: T score -1.3, rx calcium-vitamin D Edema and stasis dermatitis  become a issue ~ 06-2015, [place] neg DVT Poor compliance   PLAN He continue getting his care at the [place], he seesa  primary doctor, podiatrist, neurology, OT, etc. DM: Last A1c stable. Dyslipidemia: On Lipitor, last LFT satisfactory, check a FLP Depression: Patient denies. MS: wife w/ evidence of caregiver fatigue, the wife is the main caregiver and she is getting no help.  [facility] but pt  would not let them help.  They are extensively counseled. Prostate cancer screening: Patient's wife is very interested in a PSA, had a long discussion about pros and cons in his  particularly situation, if the PSA is elevated it may lead to more testing with associated risks, he may not be a candidate for any aggressive treatment, he might die from non cancer issues  even if prostate cancer is dx.  At the end of the discussion we agreed to check a PSA.  If it is modestly elevated the patient will be perfectly okay by observing. RTC 6 months Today, I spent more than 30   min with the patient and his wife, listening to all their concerns and counseling them in regards caregiver fatigue, particularly prevention of it.

## 2018-02-26 NOTE — Patient Instructions (Signed)
GO TO THE LAB : Get the blood work     GO TO THE FRONT DESK Schedule your next appointment for a checkup in 6 months   The 36-Hour Day a book by Denver Faster and Theron Arista Rabins is a detailed self-help guide for people caring for loved ones with Alzheimer's disease, dementia, and other memory impairments.

## 2018-02-27 LAB — LIPID PANEL
CHOL/HDL RATIO: 2
Cholesterol: 110 mg/dL (ref 0–200)
HDL: 45.1 mg/dL (ref >39.00)
LDL CALC: 42 mg/dL (ref 0–99)
NONHDL: 64.68
Triglycerides: 115 mg/dL (ref 0.0–149.0)
VLDL: 23 mg/dL (ref 0.0–40.0)

## 2018-02-27 LAB — PSA: PSA: 0.58 ng/mL (ref 0.10–4.00)

## 2018-02-27 NOTE — Assessment & Plan Note (Signed)
He continue getting his care at the [place], he seesa  primary doctor, podiatrist, neurology, OT, etc. DM: Last A1c stable. Dyslipidemia: On Lipitor, last LFT satisfactory, check a FLP Depression: Patient denies. MS: wife w/ evidence of caregiver fatigue, the wife is the main caregiver and she is getting no help.  [facility] but pt  would not let them help.  They are extensively counseled. Prostate cancer screening: Patient's wife is very interested in a PSA, had a long discussion about pros and cons in his  particularly situation, if the PSA is elevated it may lead to more testing with associated risks, he may not be a candidate for any aggressive treatment, he might die from non cancer issues  even if prostate cancer is dx.  At the end of the discussion we agreed to check a PSA.  If it is modestly elevated the patient will be perfectly okay by observing. RTC 6 months

## 2018-06-20 ENCOUNTER — Inpatient Hospital Stay: Payer: Medicare Other | Admitting: Internal Medicine

## 2018-06-27 ENCOUNTER — Ambulatory Visit (INDEPENDENT_AMBULATORY_CARE_PROVIDER_SITE_OTHER): Payer: Medicare Other | Admitting: Internal Medicine

## 2018-06-27 ENCOUNTER — Encounter: Payer: Self-pay | Admitting: Internal Medicine

## 2018-06-27 VITALS — BP 122/70 | HR 82 | Temp 97.8°F | Resp 16 | Ht 66.0 in | Wt 198.0 lb

## 2018-06-27 DIAGNOSIS — G35 Multiple sclerosis: Secondary | ICD-10-CM

## 2018-06-27 DIAGNOSIS — W19XXXD Unspecified fall, subsequent encounter: Secondary | ICD-10-CM

## 2018-06-27 NOTE — Patient Instructions (Addendum)
See you in September

## 2018-06-27 NOTE — Progress Notes (Signed)
Pre visit review using our clinic review tool, if applicable. No additional management support is needed unless otherwise documented below in the visit note. 
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
## 2018-09-01 ENCOUNTER — Ambulatory Visit (INDEPENDENT_AMBULATORY_CARE_PROVIDER_SITE_OTHER): Payer: Medicare Other | Admitting: Internal Medicine

## 2018-09-01 ENCOUNTER — Encounter: Payer: Self-pay | Admitting: Internal Medicine

## 2018-09-01 VITALS — BP 126/78 | HR 84 | Temp 97.9°F | Resp 16 | Ht 66.0 in

## 2018-09-01 DIAGNOSIS — E119 Type 2 diabetes mellitus without complications: Secondary | ICD-10-CM | POA: Diagnosis not present

## 2018-09-01 DIAGNOSIS — N481 Balanitis: Secondary | ICD-10-CM | POA: Diagnosis not present

## 2018-09-01 DIAGNOSIS — E785 Hyperlipidemia, unspecified: Secondary | ICD-10-CM | POA: Diagnosis not present

## 2018-09-01 DIAGNOSIS — R5383 Other fatigue: Secondary | ICD-10-CM | POA: Diagnosis not present

## 2018-09-01 DIAGNOSIS — G35 Multiple sclerosis: Secondary | ICD-10-CM

## 2018-09-01 DIAGNOSIS — Z23 Encounter for immunization: Secondary | ICD-10-CM | POA: Diagnosis not present

## 2018-09-01 MED ORDER — HYDROCORTISONE 2.5 % EX CREA: TOPICAL_CREAM | Freq: Two times a day (BID) | CUTANEOUS | 0 refills | Status: DC

## 2018-09-01 MED ORDER — KETOCONAZOLE 2 % EX CREA: 1.0000 "application " | TOPICAL_CREAM | Freq: Every day | CUTANEOUS | 0 refills | Status: DC

## 2018-09-01 NOTE — Progress Notes (Signed)
Subjective:    Patient ID: Oscar Matthews., male    DOB: 1951/07/29, 67 y.o.   MRN: [id number]  DOS:  09/01/2018 Type of visit - description : rov Interval history: Since the last office visit he is doing well. Frequent falls: No further falls Has developed irritation of the foreskin for the last few days, so irritated that he oozes a little bit of blood. Problems started since he started to use condom catheter from [employer]. History of explosive bowel movements according to the patient wife, going on for 1 year.  No blood in the stools, no diarrhea per se, patient does not agree with the wife description of BMs.   Review of Systems Denies difficulty voiding, fever or chills. Penile rash did not the start of blisters.  Is not itching or burning.   Past Medical History:  Diagnosis Date  . Diabetes mellitus    adult onset  . Dyslipidemia   . ED (erectile dysfunction)   . Hypogonadism male    Followup at the [place]  . Incontinence   . Multiple sclerosis (HCC) 80s   final dx in the 90s, [facility])  . Osteopenia 11/26/2012    Past Surgical History:  Procedure Laterality Date  . UMBILICAL HERNIA REPAIR      Social History   Socioeconomic History  . Marital status: Married    Spouse name: Not on file  . Number of children: 1  . Years of education: Not on file  . Highest education level: Not on file  Occupational History  . Occupation: Retired Technical sales engineer  . Financial resource strain: Not on file  . Food insecurity:    Worry: Not on file    Inability: Not on file  . Transportation needs:    Medical: Not on file    Non-medical: Not on file  Tobacco Use  . Smoking status: Former Games developer  . Smokeless tobacco: Never Used  . Tobacco comment: quit in the 80s  Substance and Sexual Activity  . Alcohol use: Yes    Comment: rarely has  wine   . Drug use: No  . Sexual activity: Never  Lifestyle  . Physical activity:    Days per week: Not  on file    Minutes per session: Not on file  . Stress: Not on file  Relationships  . Social connections:    Talks on phone: Not on file    Gets together: Not on file    Attends religious service: Not on file    Active member of club or organization: Not on file    Attends meetings of clubs or organizations: Not on file    Relationship status: Not on file  . Intimate partner violence:    Fear of current or ex partner: Not on file    Emotionally abused: Not on file    Physically abused: Not on file    Forced sexual activity: Not on file  Other Topics Concern  . Not on file  Social History Narrative   Lives w/ wife   Has a daughter, 3 G-kids      Allergies as of 09/01/2018   No Known Allergies     Medication List        Accurate as of 09/01/18 11:59 PM. Always use your most recent med list.          acetaminophen 500 MG tablet Commonly known as:  TYLENOL Take 500  mg by mouth every 6 (six) hours as needed.   aspirin 81 MG tablet Take 81 mg by mouth daily. Reported on 01/24/2016   atorvastatin 20 MG tablet Commonly known as:  LIPITOR Take 20 mg by mouth daily.   AUBAGIO 14 MG Tabs Generic drug:  Teriflunomide Take 14 mg by mouth daily.   Castellani Paint 1.5 % Liqd Apply 1 application topically 2 (two) times daily.   furosemide 40 MG tablet Commonly known as:  LASIX Take 80 mg by mouth daily.   gabapentin 800 MG tablet Commonly known as:  NEURONTIN Take 800 mg by mouth 3 (three) times daily. Reported on 01/24/2016   hydrocortisone 2.5 % cream Apply topically 2 (two) times daily.   ketoconazole 2 % cream Commonly known as:  NIZORAL Apply 1 application topically daily.   MYRBETRIQ 50 MG Tb24 tablet Generic drug:  mirabegron ER Take 50 mg by mouth daily.   potassium chloride SA 20 MEQ tablet Commonly known as:  K-DUR,KLOR-CON Take 20 mEq by mouth daily.   RAPAFLO 8 MG Caps capsule Generic drug:  silodosin Take 8 mg by mouth daily with breakfast.     vitamin C 500 MG tablet Commonly known as:  ASCORBIC ACID Take 500 mg by mouth daily.   Vitamin D-3 1000 units Caps Take 4,000 Units by mouth daily.          Objective:   Physical Exam BP 126/78 (BP Location: Left Arm, Patient Position: Sitting, Cuff Size: Small)   Pulse 84   Temp 97.9 F (36.6 C) (Oral)   Resp 16   Ht 5\' 6"  (1.676 m)   SpO2 98%   BMI 31.96 kg/m  General:   Well developed, NAD, see BMI.  HEENT:  Normocephalic . Face symmetric, atraumatic Lungs:  CTA B Normal respiratory effort, no intercostal retractions, no accessory muscle use. Heart: RRR,  no murmur.  No pretibial edema bilaterally GU: Penis slightly swollen, status post circumcision, + irritation at the foreskin to the point of some areas bleeding. Urinary meatus without discharge or lesions. Skin: Not pale. Not jaundice Neurologic:  alert & oriented X3.  Speech normal, at baseline, consistent with MS Psych--  Cognition and judgment appear intact.  Cooperative with normal attention span and concentration.  Behavior appropriate. No anxious or depressed appearing.      Assessment & Plan:   Assessment: DM -- f/u by pcp Dyslipidemia Depression NEURO: --Multiple is sclerosis   Dx  80s, [facility] -- [place] -- s/c > 18 PT visits 2017 --Urinary incontinence  Dr Jinny Sanders R leg pain-- [facility]abapentin [facility] at the [facility] Osteopenia  Dx  by dexa  2013, T score -1.6.  DEXA-2017: T score -1.3, rx calcium-vitamin D Edema and stasis dermatitis  become a issue ~ 06-2015, [place] neg DVT; on lasix Poor compliance   PLAN DM: Diet controlled, check a A1c Dyslipidemia: Last FLP satisfactory, on Lipitor. Edema: On Lasix and potassium, checking labs. Falls: See last visit, no further events Caregiver fatigue: Today I spoke with the wife, she is doing better, on looking back at the last visit she was very depressed because she lost her mother.  Things are better. Balanitis:  Started after he used condom catheter he got from [employer].   Reaction to it?  Recommend avoidance,   will get some condom catheters from the [place].  Will treat with a mix of ketoconazole and steroids.  See prescriptions. Preventive care: Td today,  get the flu shots at the [place], PSA 02/26/2018: 0.58. RTC 6 months

## 2018-09-01 NOTE — Progress Notes (Signed)
Pre visit review using our clinic review tool, if applicable. No additional management support is needed unless otherwise documented below in the visit note. 

## 2018-09-01 NOTE — Patient Instructions (Addendum)
GO TO THE LAB : Get the blood work     GO TO THE FRONT DESK Schedule your next appointment for a a checkup in 6 months  Do not use the condom catheters you have at home [facility] ones [facility] but stop them too if you have a reaction Use ketoconazole twice a day for 10 days Use hydrocortisone twice a day for 10 days Call if not better, call if you get worse.

## 2018-09-02 LAB — CBC WITH DIFFERENTIAL/PLATELET
BASOS ABS: 0.1 10*3/uL (ref 0.0–0.1)
Basophils Relative: 1.7 % (ref 0.0–3.0)
Eosinophils Absolute: 0.3 10*3/uL (ref 0.0–0.7)
Eosinophils Relative: 8.2 % — ABNORMAL HIGH (ref 0.0–5.0)
HCT: 40.5 % (ref 39.0–52.0)
Hemoglobin: 13.1 g/dL (ref 13.0–17.0)
LYMPHS ABS: 0.9 10*3/uL (ref 0.7–4.0)
Lymphocytes Relative: 28.5 % (ref 12.0–46.0)
MCHC: 32.4 g/dL (ref 30.0–36.0)
MCV: 81.3 fl (ref 78.0–100.0)
MONO ABS: 0.4 10*3/uL (ref 0.1–1.0)
MONOS PCT: 14.4 % — AB (ref 3.0–12.0)
NEUTROS PCT: 47.2 % (ref 43.0–77.0)
Neutro Abs: 1.4 10*3/uL (ref 1.4–7.7)
Platelets: 191 10*3/uL (ref 150.0–400.0)
RBC: 4.98 Mil/uL (ref 4.22–5.81)
RDW: 14.6 % (ref 11.5–15.5)
WBC: 3.1 10*3/uL — ABNORMAL LOW (ref 4.0–10.5)

## 2018-09-02 LAB — BASIC METABOLIC PANEL
BUN: 18 mg/dL (ref 6–23)
CALCIUM: 9.2 mg/dL (ref 8.4–10.5)
CO2: 28 mEq/L (ref 19–32)
Chloride: 104 mEq/L (ref 96–112)
Creatinine, Ser: 0.9 mg/dL (ref 0.40–1.50)
GFR: 107.55 mL/min (ref >60.00)
GLUCOSE: 115 mg/dL — AB (ref 70–99)
Potassium: 3.8 mEq/L (ref 3.5–5.1)
SODIUM: 140 meq/L (ref 135–145)

## 2018-09-02 LAB — HEMOGLOBIN A1C: HEMOGLOBIN A1C: 6.1 % (ref 4.6–6.5)

## 2018-09-02 NOTE — Assessment & Plan Note (Signed)
DM: Diet controlled, check a A1c Dyslipidemia: Last FLP satisfactory, on Lipitor. Edema: On Lasix and potassium, checking labs. Falls: See last visit, no further events Caregiver fatigue: Today I spoke with the wife, she is doing better, on looking back at the last visit she was very depressed because she lost her mother.  Things are better. Balanitis: Started after he used condom catheter he got from [employer].   Reaction to it?  Recommend avoidance,   will get some condom catheters from the [place].  Will treat with a mix of ketoconazole and steroids.  See prescriptions. Preventive care: Td today, get the flu shots at the [place], PSA 02/26/2018: 0.58. RTC 6 months

## 2018-10-02 LAB — HM DIABETES EYE EXAM

## 2019-03-04 ENCOUNTER — Ambulatory Visit: Payer: Medicare Other | Admitting: Internal Medicine

## 2019-03-04 ENCOUNTER — Ambulatory Visit (INDEPENDENT_AMBULATORY_CARE_PROVIDER_SITE_OTHER): Payer: Medicare Other | Admitting: Internal Medicine

## 2019-03-04 ENCOUNTER — Encounter: Payer: Self-pay | Admitting: Internal Medicine

## 2019-03-04 ENCOUNTER — Other Ambulatory Visit: Payer: Self-pay

## 2019-03-04 VITALS — BP 124/68 | HR 97 | Temp 98.0°F | Resp 16 | Wt 200.0 lb

## 2019-03-04 DIAGNOSIS — L309 Dermatitis, unspecified: Secondary | ICD-10-CM

## 2019-03-04 DIAGNOSIS — G35 Multiple sclerosis: Secondary | ICD-10-CM | POA: Diagnosis not present

## 2019-03-04 DIAGNOSIS — E119 Type 2 diabetes mellitus without complications: Secondary | ICD-10-CM | POA: Diagnosis not present

## 2019-03-04 DIAGNOSIS — R5383 Other fatigue: Secondary | ICD-10-CM

## 2019-03-04 DIAGNOSIS — E785 Hyperlipidemia, unspecified: Secondary | ICD-10-CM

## 2019-03-04 DIAGNOSIS — R296 Repeated falls: Secondary | ICD-10-CM

## 2019-03-04 MED ORDER — KETOCONAZOLE 2 % EX CREA: 1.0000 "application " | TOPICAL_CREAM | Freq: Every day | CUTANEOUS | 0 refills | Status: DC

## 2019-03-04 NOTE — Progress Notes (Signed)
Pre visit review using our clinic review tool, if applicable. No additional management support is needed unless otherwise documented below in the visit note. 

## 2019-03-04 NOTE — Progress Notes (Signed)
Subjective:    Patient ID: Oscar Kern., male    DOB: 05-30-1951, 68 y.o.   MRN: [id number]  DOS:  03/04/2019 Type of visit - description: Routine visit, here with his wife. Since the last office visit in general he is well however the wife reports several falls recently. No major consequences from the falls. He reports he is feet are cold, when I examined them I noted chronic changes on his feet & pretibial areas. Reports good compliance with his medicines.   Review of Systems   Past Medical History:  Diagnosis Date  . Diabetes mellitus    adult onset  . Dyslipidemia   . ED (erectile dysfunction)   . Hypogonadism male    Followup at the [place]  . Incontinence   . Multiple sclerosis (HCC) 80s   final dx in the 90s, [facility] ([facility])  . Osteopenia 11/26/2012    Past Surgical History:  Procedure Laterality Date  . UMBILICAL HERNIA REPAIR      Social History   Socioeconomic History  . Marital status: Married    Spouse name: Not on file  . Number of children: 1  . Years of education: Not on file  . Highest education level: Not on file  Occupational History  . Occupation: Retired Technical sales engineer  . Financial resource strain: Not on file  . Food insecurity:    Worry: Not on file    Inability: Not on file  . Transportation needs:    Medical: Not on file    Non-medical: Not on file  Tobacco Use  . Smoking status: Former Games developer  . Smokeless tobacco: Never Used  . Tobacco comment: quit in the 80s  Substance and Sexual Activity  . Alcohol use: Yes    Comment: rarely has  wine   . Drug use: No  . Sexual activity: Never  Lifestyle  . Physical activity:    Days per week: Not on file    Minutes per session: Not on file  . Stress: Not on file  Relationships  . Social connections:    Talks on phone: Not on file    Gets together: Not on file    Attends religious service: Not on file    Active member of club or organization: Not on file     Attends meetings of clubs or organizations: Not on file    Relationship status: Not on file  . Intimate partner violence:    Fear of current or ex partner: Not on file    Emotionally abused: Not on file    Physically abused: Not on file    Forced sexual activity: Not on file  Other Topics Concern  . Not on file  Social History Narrative   Lives w/ wife   Has a daughter, 3 G-kids      Allergies as of 03/04/2019   No Known Allergies     Medication List       Accurate as of March 04, 2019  4:25 PM. Always use your most recent med list.        acetaminophen 500 MG tablet Commonly known as:  TYLENOL Take 500 mg by mouth every 6 (six) hours as needed.   aspirin 81 MG tablet Take 81 mg by mouth daily. Reported on 01/24/2016   atorvastatin 20 MG tablet Commonly known as:  LIPITOR Take 20 mg by mouth daily.   Aubagio 14 MG Tabs Generic drug:  Teriflunomide Take 14 mg by mouth daily.   carboxymethylcellulose 0.5 % Soln Commonly known as:  REFRESH PLUS Place 1 drop into both eyes daily as needed.   Castellani Paint 1.5 % Liqd Apply 1 application topically 2 (two) times daily.   fluticasone 50 MCG/ACT nasal spray Commonly known as:  FLONASE Place 1-2 sprays into both nostrils daily.   furosemide 40 MG tablet Commonly known as:  LASIX Take 80 mg by mouth daily.   gabapentin 800 MG tablet Commonly known as:  NEURONTIN Take 800 mg by mouth 3 (three) times daily. Reported on 01/24/2016   hydrocortisone 2.5 % cream Apply topically 2 (two) times daily.   ketoconazole 2 % cream Commonly known as:  NIZORAL Apply 1 application topically daily.   Myrbetriq 50 MG Tb24 tablet Generic drug:  mirabegron ER Take 50 mg by mouth daily.   potassium chloride SA 20 MEQ tablet Commonly known as:  K-DUR,KLOR-CON Take 20 mEq by mouth daily.   Rapaflo 8 MG Caps capsule Generic drug:  silodosin Take 8 mg by mouth daily with breakfast.   vitamin C 500 MG tablet Commonly  known as:  ASCORBIC ACID Take 500 mg by mouth daily.   Vitamin D-3 25 MCG (1000 UT) Caps Take 4,000 Units by mouth daily.           Objective:   Physical Exam BP 124/68 (BP Location: Left Arm, Patient Position: Sitting, Cuff Size: Small)   Pulse 97   Temp 98 F (36.7 C) (Oral)   Resp 16   Wt 200 lb (90.7 kg) Comment: wheelchair  SpO2 96%   BMI 32.28 kg/m  General:   Well developed, NAD HEENT:  Normocephalic . Face symmetric, atraumatic Lungs:  CTA B Normal respiratory effort, no intercostal retractions, no accessory muscle use. Heart: RRR,  no murmur. Skin: Thickened skin on the pretibial areas, chronic changes. Thick, dry, corrugated skin on the dorsum of the feet. Maceration between the toes.  See pictures Neurologic:  alert & oriented X3.  Speech normal, at baseline, consistent with MS Psych--  Cognition and judgment appear intact.  Cooperative with normal attention span and concentration.  Behavior appropriate. No anxious or depressed appearing.                Assessment      Assessment: DM -- f/u by pcp Dyslipidemia Depression NEURO: --Multiple is sclerosis   Dx  80s, [facility] -- [place] -- s/c > 18 PT visits 2017 --Urinary incontinence  Dr Jinny Sanders R leg pain-- [facility] Osteopenia  Dx  by dexa  2013, T score -1.6.  DEXA-2017: T score -1.3, rx calcium-vitamin D Edema and stasis dermatitis  become a issue ~ 06-2015, [place] neg DVT; on lasix Poor compliance   PLAN DM: We will check a BMP, AST, ALT, CBC.  Continue diet control. High cholesterol: On Lipitor, checking a FLP Dermatitis: Chronic, at the pretibial areas and dorsum of the feet.  Some crackling noted, + evidence of fungal infection between the toes.  Risk of cellulitis discussed with the patient and the wife.  We will treat fungal dermatitis with ketoconazole, recommend to see a dermatologist, referral sent. Falls: The wife  reports that the [place] did not offer them physical therapy, will try home physical therapy. Caregiver fatigue: The patient's wife continue to  display some stress, counseled. RTC 6 months   Today, I spent more than 25  min with the patient: >50% of the time counseling regards dermatitis, risk of cellulitis, also addressing caregiver fatigue

## 2019-03-04 NOTE — Patient Instructions (Addendum)
Please schedule Medicare Wellness with Lawanna Kobus.     GO TO THE FRONT DESK Schedule labs to be done this week, fasting  Schedule your next appointment for a checkup in 6 months  We are referring you to a dermatologist  We are referring you to a physical therapist  Please apply the cream cold ketoconazole between your toes twice a day for 10 days

## 2019-03-06 ENCOUNTER — Other Ambulatory Visit (INDEPENDENT_AMBULATORY_CARE_PROVIDER_SITE_OTHER): Payer: Medicare Other

## 2019-03-06 ENCOUNTER — Other Ambulatory Visit: Payer: Self-pay

## 2019-03-06 DIAGNOSIS — E785 Hyperlipidemia, unspecified: Secondary | ICD-10-CM

## 2019-03-06 DIAGNOSIS — E119 Type 2 diabetes mellitus without complications: Secondary | ICD-10-CM | POA: Diagnosis not present

## 2019-03-06 LAB — BASIC METABOLIC PANEL
BUN: 17 mg/dL (ref 6–23)
CO2: 32 mEq/L (ref 19–32)
CREATININE: 1 mg/dL (ref 0.40–1.50)
Calcium: 9.5 mg/dL (ref 8.4–10.5)
Chloride: 103 mEq/L (ref 96–112)
GFR: 89.47 mL/min (ref >60.00)
Glucose, Bld: 110 mg/dL — ABNORMAL HIGH (ref 70–99)
Potassium: 4.6 mEq/L (ref 3.5–5.1)
Sodium: 143 mEq/L (ref 135–145)

## 2019-03-06 LAB — HEMOGLOBIN A1C: Hgb A1c MFr Bld: 6.2 % (ref 4.6–6.5)

## 2019-03-06 LAB — LIPID PANEL
Cholesterol: 104 mg/dL (ref 0–200)
HDL: 47.1 mg/dL (ref >39.00)
LDL Cholesterol: 39 mg/dL (ref 0–99)
NonHDL: 56.73
Total CHOL/HDL Ratio: 2
Triglycerides: 91 mg/dL (ref 0.0–149.0)
VLDL: 18.2 mg/dL (ref 0.0–40.0)

## 2019-03-06 LAB — AST: AST: 21 U/L (ref 0–37)

## 2019-03-06 LAB — ALT: ALT: 21 U/L (ref 0–53)

## 2019-03-06 NOTE — Assessment & Plan Note (Signed)
DM: We will check a BMP, AST, ALT, CBC.  Continue diet control. High cholesterol: On Lipitor, checking a FLP Dermatitis: Chronic, at the pretibial areas and dorsum of the feet.  Some crackling noted, + evidence of fungal infection between the toes.  Risk of cellulitis discussed with the patient and the wife.  We will treat fungal dermatitis with ketoconazole, recommend to see a dermatologist, referral sent. Falls: The wife reports that the [place] did not offer them physical therapy, will try home physical therapy. Caregiver fatigue: The patient's wife continue to  display some stress, counseled. RTC 6 months

## 2019-03-10 ENCOUNTER — Encounter: Payer: Self-pay | Admitting: Internal Medicine

## 2019-03-10 ENCOUNTER — Telehealth: Payer: Self-pay | Admitting: *Deleted

## 2019-03-10 ENCOUNTER — Telehealth: Payer: Self-pay | Admitting: Internal Medicine

## 2019-03-10 NOTE — Telephone Encounter (Signed)
Noted  

## 2019-03-10 NOTE — Telephone Encounter (Signed)
Received Medical records from [facility]; forwarded to provider/SLS 03/16

## 2019-03-10 NOTE — Telephone Encounter (Signed)
Copied from CRM [id number]. Topic: Quick Communication - See Telephone Encounter >> Mar 10, 2019  2:40 PM Debroah Loop wrote: Reason for TE: Bjorn Loser, clinical manager for [facility] calling to notify Dr. Drue Novel that home care will begin 03/13/2019.

## 2019-03-17 ENCOUNTER — Telehealth: Payer: Self-pay | Admitting: Internal Medicine

## 2019-03-17 NOTE — Telephone Encounter (Signed)
Copied from CRM [id number]. Topic: Quick Communication - Home Health Verbal Orders >> Mar 17, 2019  4:25 PM Fanny Bien wrote: Caller/Agency: [facility] Seen Callback [phone] Morrie Sheldon called and stated that patient has decided to decline home health at this time because of covid-19. Pt states that he will call home health when he is ready to start services.

## 2019-03-17 NOTE — Telephone Encounter (Signed)
Noted  

## 2019-07-17 ENCOUNTER — Encounter: Payer: Self-pay | Admitting: Internal Medicine

## 2019-07-20 ENCOUNTER — Telehealth: Payer: Self-pay | Admitting: Internal Medicine

## 2019-07-20 ENCOUNTER — Other Ambulatory Visit: Payer: Self-pay

## 2019-07-20 NOTE — Telephone Encounter (Signed)
Spoke with Vaughan Basta, pts wife, she will call back to schedule in person visit.

## 2019-07-20 NOTE — Telephone Encounter (Signed)
-----   Message from [place], [place] sent at 07/20/2019  7:41 AM EDT ----- Regarding: FW: Referral Needs in person visit please.  ----- Message ----- From: Colon Branch, MD Sent: 07/18/2019   5:17 PM EDT To: Damita Dunnings, CMA Subject: RE: Referral                                   Schedule a OV in person ----- Message ----- From: Damita Dunnings, CMA Sent: 07/17/2019   5:37 PM EDT To: Colon Branch, MD Subject: RE: Referral                                   Last ov 02/2019- must be within the last 90 days for [facility] orders.  ----- Message ----- From: Colon Branch, MD Sent: 07/17/2019   4:50 PM EDT To: Damita Dunnings, CMA Subject: Referral                                       Arrange for a home health wound care nurse to visit the patientDX pressure ulcers at buttocksPlease tell the home health agency to let us know if that service is not available

## 2019-07-28 ENCOUNTER — Ambulatory Visit: Payer: Medicare Other | Admitting: Internal Medicine

## 2019-07-29 ENCOUNTER — Ambulatory Visit (INDEPENDENT_AMBULATORY_CARE_PROVIDER_SITE_OTHER): Payer: Medicare Other | Admitting: Internal Medicine

## 2019-07-29 ENCOUNTER — Other Ambulatory Visit: Payer: Self-pay

## 2019-07-29 DIAGNOSIS — Z20822 Contact with and (suspected) exposure to covid-19: Secondary | ICD-10-CM

## 2019-07-29 DIAGNOSIS — L89309 Pressure ulcer of unspecified buttock, unspecified stage: Secondary | ICD-10-CM

## 2019-07-29 DIAGNOSIS — Z20828 Contact with and (suspected) exposure to other viral communicable diseases: Secondary | ICD-10-CM | POA: Diagnosis not present

## 2019-07-29 NOTE — Progress Notes (Signed)
Subjective:    Patient ID: Oscar Matthews., male    DOB: 03-18-1951, 68 y.o.   MRN: [id number]  DOS:  07/29/2019 Type of visit - description: Attempted  to make this a video visit, due to technical difficulties from the patient side it was not possible  thus we proceeded with a Virtual Visit via Telephone    I connected with@   by telephone and verified that I am speaking with the correct person using two identifiers.  THIS ENCOUNTER IS A VIRTUAL VISIT DUE TO COVID-19 - PATIENT WAS NOT SEEN IN THE OFFICE. PATIENT HAS CONSENTED TO VIRTUAL VISIT / TELEMEDICINE VISIT   Location of patient: home  Location of provider: office  I discussed the limitations, risks, security and privacy concerns of performing an evaluation and management service by telephone and the availability of in person appointments. I also discussed with the patient that there may be a patient responsible charge related to this service. The patient expressed understanding and agreed to proceed.   History of Present Illness: Acute 3 technicians were at his house fixing the air conditioning last week, last contact was 4 days ago, they were wearing masks. They are concerned because they got a phone call and learned that 1 of the technician recently tested positive for COVID-19. Christy Sartorius is doing well except for episode of diarrhea yesterday, no abdominal pain no blood in the stools.     Review of Systems No fever or chills, he and his wife have been checking the temperature No chest pain no difficulty breathing  Past Medical History:  Diagnosis Date  . Diabetes mellitus    adult onset  . Dyslipidemia   . ED (erectile dysfunction)   . Hypogonadism male    Followup at the [place]  . Incontinence   . Multiple sclerosis ([place]) 80s   final dx in the 90s, neurology Dr. is @ [facility] (goes twice a year)  . Osteopenia 11/26/2012    Past Surgical History:  Procedure Laterality Date  . UMBILICAL HERNIA REPAIR      Social History    Socioeconomic History  . Marital status: Married    Spouse name: Not on file  . Number of children: 1  . Years of education: Not on file  . Highest education level: Not on file  Occupational History  . Occupation: Retired Librarian, academic  . Financial resource strain: Not on file  . Food insecurity    Worry: Not on file    Inability: Not on file  . Transportation needs    Medical: Not on file    Non-medical: Not on file  Tobacco Use  . Smoking status: Former Research scientist (life sciences)  . Smokeless tobacco: Never Used  . Tobacco comment: quit in the 80s  Substance and Sexual Activity  . Alcohol use: Yes    Comment: rarely has  wine   . Drug use: No  . Sexual activity: Never  Lifestyle  . Physical activity    Days per week: Not on file    Minutes per session: Not on file  . Stress: Not on file  Relationships  . Social Herbalist on phone: Not on file    Gets together: Not on file    Attends religious service: Not on file    Active member of club or organization: Not on file    Attends meetings of clubs or organizations: Not on file    Relationship status: Not on file  .  Intimate partner violence    Fear of current or ex partner: Not on file    Emotionally abused: Not on file    Physically abused: Not on file    Forced sexual activity: Not on file  Other Topics Concern  . Not on file  Social History Narrative   Lives w/ wife   Has a daughter, 3 G-kids      Allergies as of 07/29/2019   No Known Allergies     Medication List       Accurate as of July 29, 2019  3:23 PM. If you have any questions, ask your nurse or doctor.        acetaminophen 500 MG tablet Commonly known as: TYLENOL Take 500 mg by mouth every 6 (six) hours as needed.   aspirin 81 MG tablet Take 81 mg by mouth daily. Reported on 01/24/2016   atorvastatin 20 MG tablet Commonly known as: LIPITOR Take 20 mg by mouth daily.   Aubagio 14 MG Tabs Generic drug: Teriflunomide Take 14 mg by mouth  daily.   carboxymethylcellulose 0.5 % Soln Commonly known as: REFRESH PLUS Place 1 drop into both eyes daily as needed.   Castellani Paint 1.5 % Liqd Apply 1 application topically 2 (two) times daily.   fluticasone 50 MCG/ACT nasal spray Commonly known as: FLONASE Place 1-2 sprays into both nostrils daily.   furosemide 40 MG tablet Commonly known as: LASIX Take 80 mg by mouth daily.   gabapentin 800 MG tablet Commonly known as: NEURONTIN Take 800 mg by mouth 3 (three) times daily. Reported on 01/24/2016   ketoconazole 2 % cream Commonly known as: NIZORAL Apply 1 application topically daily.   Myrbetriq 50 MG Tb24 tablet Generic drug: mirabegron ER Take 50 mg by mouth daily.   potassium chloride SA 20 MEQ tablet Commonly known as: K-DUR Take 20 mEq by mouth daily.   Rapaflo 8 MG Caps capsule Generic drug: silodosin Take 8 mg by mouth daily with breakfast.   vitamin C 500 MG tablet Commonly known as: ASCORBIC ACID Take 500 mg by mouth daily.   Vitamin D-3 25 MCG (1000 UT) Caps Take 4,000 Units by mouth daily.           Objective:   Physical Exam There were no vitals taken for this visit. This is a virtual phone interview, he sounded at baseline, voice is low and deep.  Communication was greatly helped by the patient's wife.     Assessment     Assessment: DM -- f/u by pcp Dyslipidemia Depression NEURO: --Multiple is sclerosis   Dx  80s, [facility] [place] -- s/c > 18 PT visits 2017 --Urinary incontinence  Dr Jinny Sandersennembaum R leg pain-- [facility]  Dx  by dexa  2013, T score -1.6.  DEXA-2017: T score -1.3, rx calcium-vitamin D Edema and stasis dermatitis  become a issue ~ 06-2015, [place] neg DVT; on lasix Poor compliance   PLAN.  COVID-19 exposure: As described above, this is a low to moderate risk situation.  Channing MuttersRoy is however high risk for complications.  No symptoms of COVID except for an  episode of diarrhea Plan: Check COVID test. Observation at home, if mild symptoms develop he is to let me know If severe symptoms such as fever, chills, chest pain, difficulty breathing, cough: Go to the ER Continue quarantine Wife likes to be tested, message sent to wife's PCP Pressure  ulcers: Reports mild redness at the buttocks, today she also reports 3 small areas ok skin break.  Refer to home health agency (see   entries from 07/17/2019, 07/20/2019)    Today, I spent more than 25 min with the patient: >50% of the time counseling regards exposure to COVID-19, multiple questions answered, discuss red flags that indicate the need to go to the ER.  Also coordinating his care.       I discussed the assessment and treatment plan with the patient. The patient was provided an opportunity to ask questions and all were answered. The patient agreed with the plan and demonstrated an understanding of the instructions.   The patient was advised to call back or seek an in-person evaluation if the symptoms worsen or if the condition fails to improve as anticipated.  I provided  25 minutes of non-face-to-face time during this encounter.  Willow OraJose Burman Bruington, MD

## 2019-07-30 ENCOUNTER — Other Ambulatory Visit: Payer: Self-pay

## 2019-07-30 DIAGNOSIS — Z20822 Contact with and (suspected) exposure to covid-19: Secondary | ICD-10-CM

## 2019-07-30 NOTE — Assessment & Plan Note (Signed)
COVID-19 exposure: As described above, this is a low to moderate risk situation.  Oscar Matthews is however high risk for complications.  No symptoms of COVID except for an episode of diarrhea Plan: Check COVID test. Observation at home, if mild symptoms develop he is to let me know If severe symptoms such as fever, chills, chest pain, difficulty breathing, cough: Go to the ER Continue quarantine Wife likes to be tested, message sent to wife's PCP Pressure ulcers: Reports mild redness at the buttocks, today she also reports 3 small areas ok skin break.  Refer to home health agency (see   entries from 07/17/2019, 07/20/2019)

## 2019-07-31 ENCOUNTER — Ambulatory Visit: Payer: Medicare Other | Admitting: Internal Medicine

## 2019-07-31 LAB — NOVEL CORONAVIRUS, NAA: SARS-CoV-2, NAA: NOT DETECTED

## 2019-08-07 ENCOUNTER — Telehealth: Payer: Self-pay | Admitting: Internal Medicine

## 2019-08-07 NOTE — Telephone Encounter (Signed)
Oscar Matthews calling to make PCP aware that pt's wife has requested that there is a delay in his start of care services until Monday.

## 2019-08-07 NOTE — Telephone Encounter (Signed)
Okay with me thank you

## 2019-08-10 ENCOUNTER — Telehealth: Payer: Self-pay | Admitting: Internal Medicine

## 2019-08-10 NOTE — Telephone Encounter (Signed)
Thank you :)

## 2019-08-10 NOTE — Telephone Encounter (Signed)
Dorian Pod with [facility] just wanting to let Dr Larose Kells know that she saw his pt and dressed wound to right buttock applied Tumeric powder, a barrier creme and then a foam dressing. She will keep a check on it 2 times a wk and let him know of any complications

## 2019-08-10 NOTE — Telephone Encounter (Signed)
Just an FYI

## 2019-08-19 ENCOUNTER — Telehealth: Payer: Self-pay

## 2019-08-19 DIAGNOSIS — E119 Type 2 diabetes mellitus without complications: Secondary | ICD-10-CM

## 2019-08-19 DIAGNOSIS — M8580 Other specified disorders of bone density and structure, unspecified site: Secondary | ICD-10-CM

## 2019-08-19 DIAGNOSIS — L89322 Pressure ulcer of left buttock, stage 2: Secondary | ICD-10-CM | POA: Diagnosis not present

## 2019-08-19 DIAGNOSIS — E291 Testicular hypofunction: Secondary | ICD-10-CM

## 2019-08-19 DIAGNOSIS — G35 Multiple sclerosis: Secondary | ICD-10-CM

## 2019-08-19 NOTE — Telephone Encounter (Signed)
Home Health Plan of care received from [facility]- form signed and faxed back to [phone]. Form sent for scanning.

## 2019-08-26 ENCOUNTER — Telehealth: Payer: Self-pay

## 2019-08-26 NOTE — Telephone Encounter (Signed)
Copied from [place] [id number]. Topic: General - Other >> Aug 26, 2019 12:22 PM Antonieta Iba C wrote: Reason for CRM:  [facility] called in to update provider. They were helping pt with wound care. Pt's spouse discontinued services she said that she can do what home health is doing.  Phone: [phone]

## 2019-08-26 NOTE — Telephone Encounter (Signed)
FYI

## 2019-08-26 NOTE — Telephone Encounter (Signed)
Noted  

## 2019-09-02 ENCOUNTER — Other Ambulatory Visit: Payer: Self-pay

## 2019-09-02 ENCOUNTER — Encounter: Payer: Self-pay | Admitting: Internal Medicine

## 2019-09-02 ENCOUNTER — Ambulatory Visit (INDEPENDENT_AMBULATORY_CARE_PROVIDER_SITE_OTHER): Payer: Medicare Other | Admitting: Internal Medicine

## 2019-09-02 VITALS — BP 158/65 | HR 78 | Temp 97.2°F | Resp 18 | Ht 66.0 in | Wt 198.0 lb

## 2019-09-02 DIAGNOSIS — Z23 Encounter for immunization: Secondary | ICD-10-CM

## 2019-09-02 DIAGNOSIS — L89309 Pressure ulcer of unspecified buttock, unspecified stage: Secondary | ICD-10-CM

## 2019-09-02 DIAGNOSIS — E785 Hyperlipidemia, unspecified: Secondary | ICD-10-CM

## 2019-09-02 DIAGNOSIS — E119 Type 2 diabetes mellitus without complications: Secondary | ICD-10-CM

## 2019-09-02 NOTE — Progress Notes (Addendum)
Subjective:    Patient ID: Anthoney Harada., male    DOB: [birth year], 68 y.o.   MRN: [id number]  DOS:  09/02/2019 Type of visit - description: Routine checkup DM: Due for A1c Lower extremity edema: Slightly worse lately, [facility]. Pressure ulcers: Improved compared to the last time. Preventive care reviewed  BP Readings from Last 3 Encounters:  09/02/19 (!) 158/65  03/04/19 124/68  09/01/18 126/78     Review of Systems Denies fever chills No nausea, vomiting, diarrhea.  No blood in the stools Appetite is very good  Past Medical History:  Diagnosis Date  . Diabetes mellitus    adult onset  . Dyslipidemia   . ED (erectile dysfunction)   . Hypogonadism male    Followup at the [place]  . Incontinence   . Multiple sclerosis ([place]) 80s   final dx in the 90s, neurology Dr. is @ [facility] (goes twice a year)  . Osteopenia 11/26/2012    Past Surgical History:  Procedure Laterality Date  . UMBILICAL HERNIA REPAIR      Social History   Socioeconomic History  . Marital status: Married    Spouse name: Not on file  . Number of children: 1  . Years of education: Not on file  . Highest education level: Not on file  Occupational History  . Occupation: Retired Librarian, academic  . Financial resource strain: Not on file  . Food insecurity    Worry: Not on file    Inability: Not on file  . Transportation needs    Medical: Not on file    Non-medical: Not on file  Tobacco Use  . Smoking status: Former Research scientist (life sciences)  . Smokeless tobacco: Never Used  . Tobacco comment: quit in the 80s  Substance and Sexual Activity  . Alcohol use: Yes    Comment: rarely has  wine   . Drug use: No  . Sexual activity: Never  Lifestyle  . Physical activity    Days per week: Not on file    Minutes per session: Not on file  . Stress: Not on file  Relationships  . Social Herbalist on phone: Not on file    Gets together: Not on file    Attends  religious service: Not on file    Active member of club or organization: Not on file    Attends meetings of clubs or organizations: Not on file    Relationship status: Not on file  . Intimate partner violence    Fear of current or ex partner: Not on file    Emotionally abused: Not on file    Physically abused: Not on file    Forced sexual activity: Not on file  Other Topics Concern  . Not on file  Social History Narrative   Lives w/ wife   Has a daughter, 3 G-kids      Allergies as of 09/02/2019   No Known Allergies     Medication List       Accurate as of September 02, 2019 11:59 PM. If you have any questions, ask your nurse or doctor.        acetaminophen 500 MG tablet Commonly known as: TYLENOL Take 500 mg by mouth every 6 (six) hours as needed.   aspirin 81 MG tablet Take 81 mg by mouth daily. Reported on 01/24/2016   atorvastatin 20 MG tablet Commonly known as: LIPITOR Take 20 mg  by mouth daily.   Aubagio 14 MG Tabs Generic drug: Teriflunomide Take 14 mg by mouth daily.   carboxymethylcellulose 0.5 % Soln Commonly known as: REFRESH PLUS Place 1 drop into both eyes daily as needed.   Castellani Paint 1.5 % Liqd Apply 1 application topically 2 (two) times daily.   fluticasone 50 MCG/ACT nasal spray Commonly known as: FLONASE Place 1-2 sprays into both nostrils daily.   furosemide 40 MG tablet Commonly known as: LASIX Take 80 mg by mouth daily.   gabapentin 800 MG tablet Commonly known as: NEURONTIN Take 800 mg by mouth 3 (three) times daily. Reported on 01/24/2016   ketoconazole 2 % cream Commonly known as: NIZORAL Apply 1 application topically daily.   Myrbetriq 50 MG Tb24 tablet Generic drug: mirabegron ER Take 50 mg by mouth daily.   potassium chloride SA 20 MEQ tablet Commonly known as: K-DUR Take 20 mEq by mouth daily.   Rapaflo 8 MG Caps capsule Generic drug: silodosin Take 8 mg by mouth daily with breakfast.   vitamin C 500 MG tablet  Commonly known as: ASCORBIC ACID Take 500 mg by mouth daily.   Vitamin D-3 25 MCG (1000 UT) Caps Take 4,000 Units by mouth daily.           Objective:   Physical Exam BP (!) 158/65 (BP Location: Left Arm, Patient Position: Sitting, Cuff Size: Small)   Pulse 78   Temp (!) 97.2 F (36.2 C) (Temporal)   Resp 18   Ht 5\' 6"  (1.676 m)   Wt 198 lb (89.8 kg) Comment: Per Pt  SpO2 96%   BMI 31.96 kg/m  General:   NAD, BMI noted. HEENT:  Normocephalic . Face symmetric, atraumatic Lungs:  CTA B Normal respiratory effort, no intercostal retractions, no accessory muscle use. Heart: RRR,  no murmur.  Lower extremity edema slightly pitting. Abdomen: Soft, nontender Skin: Unable to see his wound (located @ the buttocks) Neurologic:  alert & oriented X3.  Speech normal, rest of the exam is consistent with history of MS.  Unable to test his gait.  Sits in a wheelchair. Psych--  Cognition and judgment appear intact.  Cooperative with normal attention span and concentration.  Behavior appropriate. No anxious or depressed appearing.      Assessment       Assessment: DM -- f/u by pcp Dyslipidemia Depression NEURO: --Multiple is sclerosis   Dx  80s, [facility] [place] -- s/c > 18 PT visits 2017 --Urinary incontinence  Dr Jinny Sanders R leg pain-- [facility]apentin [facility] at the [facility] Osteopenia  Dx  by dexa  2013, T score -1.6.  DEXA-2017: T score -1.3, rx calcium-vitamin D Edema and stasis dermatitis  become a issue ~ 06-2015, [place] neg DVT; on lasix Poor compliance   PLAN.  DM: Diet controlled, check an A1c, CBC. Slightly elevated BP, recommend to recheck. Dyslipidemia: On Lipitor, well-controlled Depression: Not an issue at this time Edema, stasis dermatitis: On Lasix, check a BMP, encouraged leg elevation, low-salt diet.  Will soon get compression stockings. Pressure ulcers: Since last visit, home health attended him, the wife requested to  stop the services, she was doing about the same things they were doing.  He is currently much better, has a sheep skin, follows frequent turns.  Currently has only 1 area that seems open, less than 1 cm in diameter.  No discharge. Preventive care: PNM 23 today, flu shot today. RTC 6 months

## 2019-09-02 NOTE — Progress Notes (Signed)
Pre visit review using our clinic review tool, if applicable. No additional management support is needed unless otherwise documented below in the visit note. 

## 2019-09-02 NOTE — Patient Instructions (Addendum)
Please schedule Medicare Wellness with Glenard Haring.   GO TO THE LAB : Get the blood work ; if our lab is closed, please schedule appointment for later this week   GO TO THE FRONT DESK Schedule your next appointment for a checkup in 6 months   Check the  blood pressure weekly BP GOAL is between 110/65 and  135/85. If it is consistently higher or lower, let me know

## 2019-09-03 LAB — CBC WITH DIFFERENTIAL/PLATELET
Basophils Absolute: 0 10*3/uL (ref 0.0–0.1)
Basophils Relative: 1.1 % (ref 0.0–3.0)
Eosinophils Absolute: 0.3 10*3/uL (ref 0.0–0.7)
Eosinophils Relative: 6.1 % — ABNORMAL HIGH (ref 0.0–5.0)
HCT: 40.6 % (ref 39.0–52.0)
Hemoglobin: 13 g/dL (ref 13.0–17.0)
Lymphocytes Relative: 22.5 % (ref 12.0–46.0)
Lymphs Abs: 1 10*3/uL (ref 0.7–4.0)
MCHC: 32.1 g/dL (ref 30.0–36.0)
MCV: 82.4 fl (ref 78.0–100.0)
Monocytes Absolute: 0.6 10*3/uL (ref 0.1–1.0)
Monocytes Relative: 14.7 % — ABNORMAL HIGH (ref 3.0–12.0)
Neutro Abs: 2.4 10*3/uL (ref 1.4–7.7)
Neutrophils Relative %: 55.6 % (ref 43.0–77.0)
Platelets: 226 10*3/uL (ref 150.0–400.0)
RBC: 4.93 Mil/uL (ref 4.22–5.81)
RDW: 14.3 % (ref 11.5–15.5)
WBC: 4.3 10*3/uL (ref 4.0–10.5)

## 2019-09-03 LAB — BASIC METABOLIC PANEL
BUN: 21 mg/dL (ref 6–23)
CO2: 32 mEq/L (ref 19–32)
Calcium: 9.3 mg/dL (ref 8.4–10.5)
Chloride: 102 mEq/L (ref 96–112)
Creatinine, Ser: 0.98 mg/dL (ref 0.40–1.50)
GFR: 91.45 mL/min (ref >60.00)
Glucose, Bld: 94 mg/dL (ref 70–99)
Potassium: 4.4 mEq/L (ref 3.5–5.1)
Sodium: 141 mEq/L (ref 135–145)

## 2019-09-03 LAB — HEMOGLOBIN A1C: Hgb A1c MFr Bld: 6 % (ref 4.6–6.5)

## 2019-09-03 NOTE — Assessment & Plan Note (Signed)
DM: Diet controlled, check an A1c, CBC. Slightly elevated BP, recommend to recheck. Dyslipidemia: On Lipitor, well-controlled Depression: Not an issue at this time Edema, stasis dermatitis: On Lasix, check a BMP, encouraged leg elevation, low-salt diet.  Will soon get compression stockings. Pressure ulcers: Since last visit, home health attended him, the wife requested to stop the services, she was doing about the same things they were doing.  He is currently much better, has a sheep skin, follows frequent turns.  Currently has only 1 area that seems open, less than 1 cm in diameter.  No discharge. Preventive care: PNM 23 today, flu shot today. RTC 6 months

## 2019-10-19 LAB — HM DIABETES FOOT EXAM

## 2019-11-09 ENCOUNTER — Encounter: Payer: Self-pay | Admitting: Internal Medicine

## 2020-01-21 ENCOUNTER — Telehealth: Payer: Self-pay | Admitting: Internal Medicine

## 2020-01-21 NOTE — Telephone Encounter (Signed)
Left message for patient to call office back to scheduled Medicare Annual Wellness.   Last AWV 02/13/2017 ; please schedule anytime with Nurse Health Advisor Mady Haagensen, RN at [facility]

## 2020-02-11 ENCOUNTER — Encounter: Payer: Self-pay | Admitting: Internal Medicine

## 2020-02-12 ENCOUNTER — Ambulatory Visit: Payer: Medicare Other | Admitting: *Deleted

## 2020-03-01 ENCOUNTER — Other Ambulatory Visit: Payer: Self-pay

## 2020-03-02 ENCOUNTER — Ambulatory Visit (INDEPENDENT_AMBULATORY_CARE_PROVIDER_SITE_OTHER): Payer: Medicare Other | Admitting: Internal Medicine

## 2020-03-02 ENCOUNTER — Encounter: Payer: Self-pay | Admitting: Internal Medicine

## 2020-03-02 ENCOUNTER — Other Ambulatory Visit: Payer: Self-pay

## 2020-03-02 VITALS — BP 110/74 | HR 68 | Temp 97.6°F | Resp 18 | Ht 66.0 in | Wt 192.4 lb

## 2020-03-02 DIAGNOSIS — F32A Depression, unspecified: Secondary | ICD-10-CM

## 2020-03-02 DIAGNOSIS — R600 Localized edema: Secondary | ICD-10-CM | POA: Diagnosis not present

## 2020-03-02 DIAGNOSIS — E1169 Type 2 diabetes mellitus with other specified complication: Secondary | ICD-10-CM

## 2020-03-02 DIAGNOSIS — E785 Hyperlipidemia, unspecified: Secondary | ICD-10-CM | POA: Diagnosis not present

## 2020-03-02 DIAGNOSIS — F329 Major depressive disorder, single episode, unspecified: Secondary | ICD-10-CM

## 2020-03-02 LAB — COMPREHENSIVE METABOLIC PANEL
ALT: 18 U/L (ref 0–53)
AST: 17 U/L (ref 0–37)
Albumin: 3.8 g/dL (ref 3.5–5.2)
Alkaline Phosphatase: 56 U/L (ref 39–117)
BUN: 14 mg/dL (ref 6–23)
CO2: 33 mEq/L — ABNORMAL HIGH (ref 19–32)
Calcium: 9.5 mg/dL (ref 8.4–10.5)
Chloride: 102 mEq/L (ref 96–112)
Creatinine, Ser: 0.85 mg/dL (ref 0.40–1.50)
GFR: 107.62 mL/min (ref >60.00)
Glucose, Bld: 89 mg/dL (ref 70–99)
Potassium: 4.2 mEq/L (ref 3.5–5.1)
Sodium: 140 mEq/L (ref 135–145)
Total Bilirubin: 0.7 mg/dL (ref 0.2–1.2)
Total Protein: 6.7 g/dL (ref 6.0–8.3)

## 2020-03-02 LAB — LIPID PANEL
Cholesterol: 111 mg/dL (ref 0–200)
HDL: 55.4 mg/dL (ref >39.00)
LDL Cholesterol: 40 mg/dL (ref 0–99)
NonHDL: 56.09
Total CHOL/HDL Ratio: 2
Triglycerides: 81 mg/dL (ref 0.0–149.0)
VLDL: 16.2 mg/dL (ref 0.0–40.0)

## 2020-03-02 NOTE — Patient Instructions (Addendum)
Please schedule Medicare Wellness with Lawanna Kobus.   Per our records you are due for an eye exam. Please contact your eye doctor to schedule an appointment. Please have them send copies of your office visit notes to [place]. Our fax number is [phone].   GO TO THE LAB : Get the blood work     GO TO THE FRONT DESK Come back for a checkup in 6 months, please make an appointment  Keep checking your blood pressures BP GOAL is between 110/65 and  135/85. If it is consistently higher or lower, let me know

## 2020-03-02 NOTE — Progress Notes (Signed)
Subjective:    Patient ID: Oscar Matthews., male    DOB: 08/27/1951, 69 y.o.   MRN: [id number]  DOS:  03/02/2020 Type of visit - description: Follow-up Since the last office visit he is doing well. He went back under the care of a wound care center and doing great. Had a Covid vaccination already. Edema is under excellent control. Medications were reconciliated  Wt Readings from Last 3 Encounters:  03/02/20 192 lb 6 oz (87.3 kg)  09/02/19 198 lb (89.8 kg)  03/04/19 200 lb (90.7 kg)     Review of Systems See above   Past Medical History:  Diagnosis Date  . Diabetes mellitus    adult onset  . Dyslipidemia   . ED (erectile dysfunction)   . Hypogonadism male    Followup at the [place]  . Incontinence   . Multiple sclerosis ([facility]) 80s   final dx in the 90s[facility]  . Osteopenia 11/26/2012    Past Surgical History:  Procedure Laterality Date  . UMBILICAL HERNIA REPAIR      Allergies as of 03/02/2020   No Known Allergies     Medication List       Accurate as of March 02, 2020 10:20 AM. If you have any questions, ask your nurse or doctor.        acetaminophen 500 MG tablet Commonly known as: TYLENOL Take 500 mg by mouth every 6 (six) hours as needed.   aspirin 81 MG tablet Take 81 mg by mouth daily. Reported on 01/24/2016   atorvastatin 20 MG tablet Commonly known as: LIPITOR Take 20 mg by mouth daily.   Aubagio 14 MG Tabs Generic drug: Teriflunomide Take 14 mg by mouth daily.   carboxymethylcellulose 0.5 % Soln Commonly known as: REFRESH PLUS Place 1 drop into both eyes daily as needed.   Castellani Paint 1.5 % Liqd Apply 1 application topically 2 (two) times daily.   fluticasone 50 MCG/ACT nasal spray Commonly known as: FLONASE Place 1-2 sprays into both nostrils daily.   furosemide 40 MG tablet Commonly known as: LASIX Take 80 mg by mouth daily.   gabapentin 800 MG tablet Commonly known as: NEURONTIN Take 800 mg  by mouth 3 (three) times daily. Reported on 01/24/2016   ketoconazole 2 % cream Commonly known as: NIZORAL Apply 1 application topically daily.   multivitamin with minerals Tabs tablet Take 1 tablet by mouth daily.   Myrbetriq 50 MG Tb24 tablet Generic drug: mirabegron ER Take 50 mg by mouth daily.   potassium chloride SA 20 MEQ tablet Commonly known as: KLOR-CON Take 20 mEq by mouth daily.   Rapaflo 8 MG Caps capsule Generic drug: silodosin Take 8 mg by mouth daily with breakfast.   vitamin C 500 MG tablet Commonly known as: ASCORBIC ACID Take 500 mg by mouth daily.   Vitamin D-3 25 MCG (1000 UT) Caps Take 4,000 Units by mouth daily.          Objective:   Physical Exam BP 110/74 (BP Location: Left Arm, Patient Position: Sitting, Cuff Size: Normal)   Pulse 68   Temp 97.6 F (36.4 C) (Temporal)   Resp 18   Ht 5\' 6"  (1.676 m)   Wt 192 lb 6 oz (87.3 kg)   SpO2 100%   BMI 31.05 kg/m  General:   Well developed, NAD, BMI noted. HEENT:  Normocephalic . Face symmetric, atraumatic Lungs:  CTA B Normal respiratory effort,  no intercostal retractions, no accessory muscle use. Heart: RRR,  no murmur.  Lower extremities: Chronic pretibial skin changes, trace edema.  Most of the pretibial area was covered. Skin: Not pale. Not jaundice Neurologic:  alert & oriented X3..  At baseline, wheelchair-bound. Psych--  Cognition and judgment appear intact.  Cooperative with normal attention span and concentration.  Behavior appropriate. No anxious or depressed appearing.      Assessment    Assessment: DM -- f/u by pcp Dyslipidemia Depression NEURO: --Multiple is sclerosis   Dx  20s, [facility] [place] -- s/c > 18 PT visits 2017 --Urinary incontinence  Dr Jenean Lindau R leg pain-- [facility]apentin [facility] Osteopenia  Dx  by dexa  2013, T score -1.6.  DEXA-2017: T score -1.3, rx calcium-vitamin D Edema and stasis dermatitis   become a issue ~ 06-2015, [place] neg DVT; on lasix Poor compliance   PLAN.  DM: Diet controlled, A1cs have been stable. Dyslipidemia: On Lipitor, check a FLP. Depression: Not an issue at this point Pressure ulcers: Resolved after he was seen at the wound care center Edema, stasis dermatitis: Lasix dose is 40 mg daily, significant improvement under the care of the wound care center [place] duty letter: The patient request a letter excusing him for duty which is totally understandable.  Letter provided. Preventive care: Had his first Covid shot RTC 6 months  This visit occurred during the SARS-CoV-2 public health emergency.  Safety protocols were in place, including screening questions prior to the visit, additional usage of staff PPE, and extensive cleaning of exam room while observing appropriate contact time as indicated for disinfecting solutions.

## 2020-03-02 NOTE — Progress Notes (Signed)
Pre visit review using our clinic review tool, if applicable. No additional management support is needed unless otherwise documented below in the visit note. 

## 2020-03-03 NOTE — Assessment & Plan Note (Signed)
DM: Diet controlled, A1cs have been stable. Dyslipidemia: On Lipitor, check a FLP. Depression: Not an issue at this point Pressure ulcers: Resolved after he was seen at the wound care center Edema, stasis dermatitis: Lasix dose is 40 mg daily, significant improvement under the care of the wound care center [place] duty letter: The patient request a letter excusing him for duty which is totally understandable.  Letter provided. Preventive care: Had his first Covid shot RTC 6 months

## 2020-04-19 MED ORDER — ASPIRIN 81 MG PO TBEC
81.00 | DELAYED_RELEASE_TABLET | ORAL | Status: DC
Start: 2020-04-20 — End: 2020-04-19

## 2020-04-19 MED ORDER — ENOXAPARIN SODIUM 40 MG/0.4ML ~~LOC~~ SOLN
40.00 | SUBCUTANEOUS | Status: DC
Start: 2020-04-19 — End: 2020-04-19

## 2020-04-19 MED ORDER — CARBOXYMETHYLCELLULOSE SODIUM 0.5 % OP SOLN
1.00 | OPHTHALMIC | Status: DC
Start: ? — End: 2020-04-19

## 2020-04-19 MED ORDER — FUROSEMIDE 40 MG PO TABS
40.00 | ORAL_TABLET | ORAL | Status: DC
Start: 2020-04-20 — End: 2020-04-19

## 2020-04-19 MED ORDER — ATORVASTATIN CALCIUM 40 MG PO TABS
20.00 | ORAL_TABLET | ORAL | Status: DC
Start: 2020-04-19 — End: 2020-04-19

## 2020-04-19 MED ORDER — TERIFLUNOMIDE 14 MG PO TABS
14.00 | ORAL_TABLET | ORAL | Status: DC
Start: 2020-04-20 — End: 2020-04-19

## 2020-04-19 MED ORDER — GABAPENTIN 300 MG PO CAPS
600.00 | ORAL_CAPSULE | ORAL | Status: DC
Start: 2020-04-19 — End: 2020-04-19

## 2020-04-19 MED ORDER — FLUTICASONE PROPIONATE 50 MCG/ACT NA SUSP
1.00 | NASAL | Status: DC
Start: 2020-04-20 — End: 2020-04-19

## 2020-04-19 MED ORDER — TAMSULOSIN HCL 0.4 MG PO CAPS
0.40 | ORAL_CAPSULE | ORAL | Status: DC
Start: 2020-04-20 — End: 2020-04-19

## 2020-04-19 MED ORDER — CHOLECALCIFEROL 25 MCG (1000 UT) PO TABS
1000.00 | ORAL_TABLET | ORAL | Status: DC
Start: 2020-04-20 — End: 2020-04-19

## 2020-04-19 MED ORDER — ACETAMINOPHEN 500 MG PO TABS
500.00 | ORAL_TABLET | ORAL | Status: DC
Start: ? — End: 2020-04-19

## 2020-06-07 ENCOUNTER — Encounter: Payer: Self-pay | Admitting: Internal Medicine

## 2020-06-07 DIAGNOSIS — R399 Unspecified symptoms and signs involving the genitourinary system: Secondary | ICD-10-CM

## 2020-06-09 ENCOUNTER — Other Ambulatory Visit (INDEPENDENT_AMBULATORY_CARE_PROVIDER_SITE_OTHER): Payer: Medicare Other

## 2020-06-09 ENCOUNTER — Other Ambulatory Visit: Payer: Self-pay

## 2020-06-09 DIAGNOSIS — R399 Unspecified symptoms and signs involving the genitourinary system: Secondary | ICD-10-CM | POA: Diagnosis not present

## 2020-06-09 LAB — URINALYSIS, ROUTINE W REFLEX MICROSCOPIC
Bilirubin Urine: NEGATIVE
Hgb urine dipstick: NEGATIVE
Leukocytes,Ua: NEGATIVE
Nitrite: NEGATIVE
RBC / HPF: NONE SEEN (ref 0–?)
Specific Gravity, Urine: 1.025 (ref 1.000–1.030)
Total Protein, Urine: NEGATIVE
Urine Glucose: NEGATIVE
Urobilinogen, UA: 0.2 (ref 0.0–1.0)
pH: 5.5 (ref 5.0–8.0)

## 2020-06-10 LAB — URINE CULTURE
MICRO NUMBER:: 10603107
SPECIMEN QUALITY:: ADEQUATE

## 2020-07-05 ENCOUNTER — Telehealth (INDEPENDENT_AMBULATORY_CARE_PROVIDER_SITE_OTHER): Payer: Medicare Other | Admitting: Internal Medicine

## 2020-07-05 ENCOUNTER — Other Ambulatory Visit: Payer: Self-pay

## 2020-07-05 ENCOUNTER — Encounter: Payer: Self-pay | Admitting: Internal Medicine

## 2020-07-05 VITALS — BP 122/64 | HR 98 | Ht 66.0 in

## 2020-07-05 DIAGNOSIS — K5900 Constipation, unspecified: Secondary | ICD-10-CM | POA: Diagnosis not present

## 2020-07-05 DIAGNOSIS — R630 Anorexia: Secondary | ICD-10-CM

## 2020-07-05 NOTE — Progress Notes (Signed)
Subjective:    Patient ID: Oscar Matthews., male    DOB: 12/18/1951, 69 y.o.   MRN: [id number]  DOS:  07/05/2020 Type of visit - description: Virtual Visit via Telephone    I connected with above mentioned patient  by telephone and verified that I am speaking with the correct person using two identifiers.  THIS ENCOUNTER IS A VIRTUAL VISIT DUE TO COVID-19 - PATIENT WAS NOT SEEN IN THE OFFICE. PATIENT HAS CONSENTED TO VIRTUAL VISIT / TELEMEDICINE VISIT   Location of patient: home  Location of provider: office  Persons participating in the virtual visit: patient, provider   I discussed the limitations, risks, security and privacy concerns of performing an evaluation and management service by telephone and the availability of in person appointments. I also discussed with the patient that there may be a patient responsible charge related to this service. The patient expressed understanding and agreed to proceed.  Acute The patient was admitted to the hospital in April after he fell and the wife was not able to get him up. Hospital stay was brief, he was sent to a rehab facility. Since he left the rehab facility about 05/08/2020 he is having on and off problems with constipation. There was a time that he did not have a BM for 1 week, the wife had to mechanically remove hard stools from the rectum.  Currently, having loose bowel movements, last one was yesterday. The patient denies fever chills No abdominal pain, nausea or vomiting. No blood in the stools.  The wife is also concerned about poor appetite. The patient denies feeling depressed, denies any abdominal pain postprandially. Unclear if he is losing weight because he is unable to get weight   Review of Systems See above   Past Medical History:  Diagnosis Date  . Diabetes mellitus    adult onset  . Dyslipidemia   . ED (erectile dysfunction)   . Hypogonadism male    Followup at the [place]  . Incontinence   . Multiple  sclerosis ([facility]) 80s   final dx in the 90s[facility]  . Osteopenia 11/26/2012    Past Surgical History:  Procedure Laterality Date  . UMBILICAL HERNIA REPAIR      Allergies as of 07/05/2020   No Known Allergies     Medication List       Accurate as of July 05, 2020  5:41 PM. If you have any questions, ask your nurse or doctor.        STOP taking these medications   furosemide 40 MG tablet Commonly known as: LASIX Stopped by: Willow Ora, MD   ketoconazole 2 % cream Commonly known as: NIZORAL Stopped by: Willow Ora, MD   potassium chloride SA 20 MEQ tablet Commonly known as: KLOR-CON Stopped by: Willow Ora, MD   Rapaflo 8 MG Caps capsule Generic drug: silodosin Stopped by: Willow Ora, MD     TAKE these medications   acetaminophen 500 MG tablet Commonly known as: TYLENOL Take 500 mg by mouth every 6 (six) hours as needed.   aspirin 81 MG tablet Take 81 mg by mouth daily. Reported on 01/24/2016   atorvastatin 20 MG tablet Commonly known as: LIPITOR Take 20 mg by mouth daily.   Aubagio 14 MG Tabs Generic drug: Teriflunomide Take 14 mg by mouth daily.   carboxymethylcellulose 0.5 % Soln Commonly known as: REFRESH PLUS Place 1 drop into both eyes daily as needed.  Castellani Paint 1.5 % Liqd Apply 1 application topically 2 (two) times daily.   fluticasone 50 MCG/ACT nasal spray Commonly known as: FLONASE Place 1-2 sprays into both nostrils daily.   gabapentin 800 MG tablet Commonly known as: NEURONTIN Take 800 mg by mouth 3 (three) times daily. Reported on 01/24/2016   multivitamin with minerals Tabs tablet Take 1 tablet by mouth daily.   Myrbetriq 50 MG Tb24 tablet Generic drug: mirabegron ER Take 50 mg by mouth daily.   vitamin C 500 MG tablet Commonly known as: ASCORBIC ACID Take 500 mg by mouth daily.   Vitamin D-3 25 MCG (1000 UT) Caps Take 4,000 Units by mouth daily.          Objective:   Physical Exam BP  122/64   Pulse 98   Ht 5\' 6"  (1.676 m)   BMI 31.05 kg/m  This is visual telephone visit, the patient is alert oriented x3, sounds at baseline, in no distress.    Assessment     Assessment: DM -- f/u by pcp Dyslipidemia Depression NEURO: --Multiple is sclerosis   Dx  80s, [facility] [place] -- s/c > 18 PT visits 2017 --Urinary incontinence  Dr 2018 R leg pain-- [facility] Osteopenia  Dx  by dexa  2013, T score -1.6.  DEXA-2017: T score -1.3, rx calcium-vitamin D Edema and stasis dermatitis  become a issue ~ 06-2015, 2014 neg DVT; on lasix Poor compliance   PLAN.  Constipation: On and off constipation, sometimes for 7 days, one time wife had to remove the stools mechanically from the rectum. Had a bowel movement yesterday, somewhat loose. Denies abdominal pain nausea or vomiting. Suspect chronic constipation without symptoms of SBO  W/  recent fecal impaction. Plan: MiraLAX daily, drink plenty of fluids. If y impaction is suspected, try glycerin suppositories and/or mechanical de-impaction. If that fails, needs to go to the ER Other ER indications are abdominal pain, nausea, fever Poor appetite: Unclear  etiology, denies depression, unclear if weight loss, recommend office visit. Unfortunately the patient is essentially now homebound. We agreed on observation for now      I discussed the assessment and treatment plan with the patient. The patient was provided an opportunity to ask questions and all were answered. The patient agreed with the plan and demonstrated an understanding of the instructions.   The patient was advised to call back or seek an in-person evaluation if the symptoms worsen or if the condition fails to improve as anticipated.  I provided 25 minutes of non-face-to-face time during this encounter.  [place], MD

## 2020-07-07 NOTE — Assessment & Plan Note (Signed)
Constipation: On and off constipation, sometimes for 7 days, one time wife had to remove the stools mechanically from the rectum. Had a bowel movement yesterday, somewhat loose. Denies abdominal pain nausea or vomiting. Suspect chronic constipation without symptoms of SBO  W/  recent fecal impaction. Plan: MiraLAX daily, drink plenty of fluids. If y impaction is suspected, try glycerin suppositories and/or mechanical de-impaction. If that fails, needs to go to the ER Other ER indications are abdominal pain, nausea, fever Poor appetite: Unclear  etiology, denies depression, unclear if weight loss, recommend office visit. Unfortunately the patient is essentially now homebound. We agreed on observation for now

## 2020-09-05 ENCOUNTER — Telehealth (INDEPENDENT_AMBULATORY_CARE_PROVIDER_SITE_OTHER): Payer: Medicare Other | Admitting: Internal Medicine

## 2020-09-05 ENCOUNTER — Encounter: Payer: Self-pay | Admitting: Internal Medicine

## 2020-09-05 ENCOUNTER — Other Ambulatory Visit: Payer: Self-pay

## 2020-09-05 VITALS — BP 122/72 | HR 73 | Temp 98.2°F | Ht 66.0 in

## 2020-09-05 DIAGNOSIS — K5900 Constipation, unspecified: Secondary | ICD-10-CM

## 2020-09-05 DIAGNOSIS — G35 Multiple sclerosis: Secondary | ICD-10-CM | POA: Diagnosis not present

## 2020-09-05 DIAGNOSIS — Z09 Encounter for follow-up examination after completed treatment for conditions other than malignant neoplasm: Secondary | ICD-10-CM | POA: Diagnosis not present

## 2020-09-05 DIAGNOSIS — E1169 Type 2 diabetes mellitus with other specified complication: Secondary | ICD-10-CM

## 2020-09-05 NOTE — Progress Notes (Signed)
Pre visit review using our clinic review tool, if applicable. No additional management support is needed unless otherwise documented below in the visit note. 

## 2020-09-05 NOTE — Progress Notes (Addendum)
Subjective:    Patient ID: Oscar Kern., male    DOB: August 20, 1951, 69 y.o.   MRN: [id number]  DOS:  09/05/2020 Type of visit - description: Virtual Visit via Telephone    I connected with above mentioned patient  by telephone and verified that I am speaking with the correct person using two identifiers.  THIS ENCOUNTER IS A VIRTUAL VISIT DUE TO COVID-19 - PATIENT WAS NOT SEEN IN THE OFFICE. PATIENT HAS CONSENTED TO VIRTUAL VISIT / TELEMEDICINE VISIT   Location of patient: home  Location of provider: office  Persons participating in the virtual visit: patient, provider   I discussed the limitations, risks, security and privacy concerns of performing an evaluation and management service by telephone and the availability of in person appointments. I also discussed with the patient that there may be a patient responsible charge related to this service. The patient expressed understanding and agreed to proceed.  Acute Today I communicate with Oscar Matthews and his wife. In general he is doing well, he does need our referral to get a aid Saturdays and Sundays for 3 hours.   Review of Systems The patient and the wife reports his appetite is better. He is in good spirits No constipation (see last visit)  Past Medical History:  Diagnosis Date  . Diabetes mellitus    adult onset  . Dyslipidemia   . ED (erectile dysfunction)   . Hypogonadism male    Followup at the [place]  . Incontinence   . Multiple sclerosis (HCC) 80s   final dx in the 90s, [facility]  . Osteopenia 11/26/2012    Past Surgical History:  Procedure Laterality Date  . UMBILICAL HERNIA REPAIR      Allergies as of 09/05/2020   No Known Allergies     Medication List       Accurate as of September 05, 2020 11:59 PM. If you have any questions, ask your nurse or doctor.        acetaminophen 500 MG tablet Commonly known as: TYLENOL Take 500 mg by mouth every 6 (six) hours as needed.    aspirin 81 MG tablet Take 81 mg by mouth daily. Reported on 01/24/2016   atorvastatin 20 MG tablet Commonly known as: LIPITOR Take 20 mg by mouth daily.   Aubagio 14 MG Tabs Generic drug: Teriflunomide Take 14 mg by mouth daily.   carboxymethylcellulose 0.5 % Soln Commonly known as: REFRESH PLUS Place 1 drop into both eyes daily as needed.   Castellani Paint 1.5 % Liqd Apply 1 application topically 2 (two) times daily.   fluticasone 50 MCG/ACT nasal spray Commonly known as: FLONASE Place 1-2 sprays into both nostrils daily.   gabapentin 800 MG tablet Commonly known as: NEURONTIN Take 800 mg by mouth 3 (three) times daily. Reported on 01/24/2016   multivitamin with minerals Tabs tablet Take 1 tablet by mouth daily.   Myrbetriq 50 MG Tb24 tablet Generic drug: mirabegron ER Take 50 mg by mouth daily.   vitamin C 500 MG tablet Commonly known as: ASCORBIC ACID Take 500 mg by mouth daily.   Vitamin D-3 25 MCG (1000 UT) Caps Take 4,000 Units by mouth daily.          Objective:   Physical Exam BP 122/72   Pulse 73   Temp 98.2 F (36.8 C)   Ht 5\' 6"  (1.676 m)   SpO2 97%   BMI 31.05 kg/m  He is  sounded well, alert oriented x3, in no distress    Assessment     Assessment: DM -- f/u by pcp Dyslipidemia Depression NEURO: --Multiple is sclerosis   Dx  80s, [facility] [place] -- s/c > 18 PT visits 2017 --Urinary incontinence  Dr Jinny Sanders R leg pain-- [facility]apentin [facility] at the [facility] Osteopenia  Dx  by dexa  2013, T score -1.6.  DEXA-2017: T score -1.3, rx calcium-vitamin D Edema and stasis dermatitis  become a issue ~ 06-2015, [place] neg DVT; on lasix Poor compliance Homebound since May 2021  PLAN.  MS: Patient is  homebound since May 2021, he is able to get out of bed  about once a week, w/ the use of Hoyer. Is taking care of by his wife. They do have help Monday to Friday but they are lacking help Saturday and  Sundays. We will refer him to a home health agency, according to the wife they spoke with a [place] healthcare representative and he does qualify for that. They also report that [facility] team doing [facility] him, the team includes a nurse practitioner, RN, dietitian, social working and OT.  Reportedly he could get blood work if needed. Constipation: See last visit, resolved Poor appetite: See last visit resolved.       I discussed the assessment and treatment plan with the patient. The patient was provided an opportunity to ask questions and all were answered. The patient agreed with the plan and demonstrated an understanding of the instructions.   The patient was advised to call back or seek an in-person evaluation if the symptoms worsen or if the condition fails to improve as anticipated.  I provided 23 minutes of non-face-to-face time during this encounter.  Willow Ora, MD

## 2020-09-06 NOTE — Assessment & Plan Note (Addendum)
MS: Patient is  homebound since May 2021, he is able to get out of bed  about once a week, w/ the use of Hoyer. Is taking care of by his wife. They do have help Monday to Friday but they are lacking help Saturday and Sundays. We will refer him to a home health agency, according to the wife they spoke with a [place] healthcare representative and he does qualify for that. They also report that he has a [facility] team doing [facility] visits for him, the team includes a nurse practitioner, RN, dietitian, social working and OT.  Reportedly he could get blood work if needed. Constipation: See last visit, resolved Poor appetite: See last visit resolved.

## 2020-09-07 ENCOUNTER — Encounter: Payer: Self-pay | Admitting: Internal Medicine

## 2020-09-28 ENCOUNTER — Encounter: Payer: Self-pay | Admitting: Internal Medicine

## 2021-03-21 ENCOUNTER — Encounter: Payer: Self-pay | Admitting: Internal Medicine

## 2021-04-11 ENCOUNTER — Other Ambulatory Visit: Payer: Self-pay

## 2021-04-11 ENCOUNTER — Ambulatory Visit (INDEPENDENT_AMBULATORY_CARE_PROVIDER_SITE_OTHER): Payer: Medicare Other | Admitting: Internal Medicine

## 2021-04-11 ENCOUNTER — Encounter: Payer: Self-pay | Admitting: Internal Medicine

## 2021-04-11 VITALS — BP 132/82 | HR 72 | Temp 97.7°F

## 2021-04-11 DIAGNOSIS — G35 Multiple sclerosis: Secondary | ICD-10-CM

## 2021-04-11 DIAGNOSIS — Z86010 Personal history of colonic polyps: Secondary | ICD-10-CM | POA: Diagnosis not present

## 2021-04-11 DIAGNOSIS — F32A Depression, unspecified: Secondary | ICD-10-CM | POA: Diagnosis not present

## 2021-04-11 DIAGNOSIS — E1169 Type 2 diabetes mellitus with other specified complication: Secondary | ICD-10-CM | POA: Diagnosis not present

## 2021-04-11 NOTE — Patient Instructions (Signed)
Please return to the office as needed

## 2021-04-11 NOTE — Progress Notes (Signed)
Subjective:    Patient ID: Oscar Matthews., male    DOB: 02/04/1951, 70 y.o.   MRN: [id number]  DOS:  04/11/2021 Type of visit - description: Routine follow-up, here with his wife  Since the last visit, he reports no major problems. He is seeing at the [place] regularly. Denies any depression. No recent ambulatory CBGs He remains essentially homebound with very rare outings. He wonders about a colonoscopy.    Review of Systems See above   Past Medical History:  Diagnosis Date  . Diabetes mellitus    adult onset  . Dyslipidemia   . ED (erectile dysfunction)   . Hypogonadism male    Followup at the [place]  . Incontinence   . Multiple sclerosis (HCC) 80s   final dx in the 90s, [facility]  . Osteopenia 11/26/2012    Past Surgical History:  Procedure Laterality Date  . UMBILICAL HERNIA REPAIR      Allergies as of 04/11/2021   No Known Allergies     Medication List       Accurate as of April 11, 2021 11:59 PM. If you have any questions, ask your nurse or doctor.        acetaminophen 500 MG tablet Commonly known as: TYLENOL Take 500 mg by mouth every 6 (six) hours as needed.   aspirin 81 MG tablet Take 81 mg by mouth daily. Reported on 01/24/2016   atorvastatin 20 MG tablet Commonly known as: LIPITOR Take 20 mg by mouth daily.   carboxymethylcellulose 0.5 % Soln Commonly known as: REFRESH PLUS Place 1 drop into both eyes daily as needed.   Castellani Paint 1.5 % Liqd Apply 1 application topically 2 (two) times daily.   fluticasone 50 MCG/ACT nasal spray Commonly known as: FLONASE Place 1-2 sprays into both nostrils daily.   gabapentin 800 MG tablet Commonly known as: NEURONTIN Take 800 mg by mouth 3 (three) times daily. Reported on 01/24/2016   MECLIZINE HCL PO Take by mouth.   multivitamin with minerals Tabs tablet Take 1 tablet by mouth daily.   Myrbetriq 50 MG Tb24 tablet Generic drug: mirabegron ER Take 50 mg by mouth  daily.   Teriflunomide 14 MG Tabs Take 14 mg by mouth daily.   vitamin C 500 MG tablet Commonly known as: ASCORBIC ACID Take 500 mg by mouth daily.   Vitamin D-3 25 MCG (1000 UT) Caps Take 4,000 Units by mouth daily.          Objective:   Physical Exam BP 132/82 (BP Location: Right Arm, Patient Position: Sitting, Cuff Size: Large)   Pulse 72   Temp 97.7 F (36.5 C) (Temporal)   SpO2 99%  General:   NAD, BMI noted. HEENT:  Normocephalic . Face symmetric, atraumatic.  Neck with very poor tone. Lungs:  CTA B Normal respiratory effort, no intercostal retractions, no accessory muscle use. Heart: RRR,  no murmur.  Lower extremities: no pretibial edema bilaterally  Skin: Not pale. Not jaundice Neurologic:  alert & oriented X3.  Speech normal, gait not tested Psych--  Cognition and judgment appear intact.  Cooperative with normal attention span and concentration.  Behavior appropriate. No anxious or depressed appearing.      Assessment      Assessment: DM -- f/u by pcp Dyslipidemia Depression NEURO: --Multiple is sclerosis   Dx  80s, [facility] [place] -- s/c > 18 PT visits 2017 --Urinary incontinence  Dr Jinny Sanders R leg pain-- [facility] at [facility] Osteopenia  Dx  by dexa  2013, T score -1.6.  DEXA-2017: T score -1.3, rx calcium-vitamin D Edema and stasis dermatitis  become a issue ~ 06-2015, [place] neg DVT; on lasix Poor compliance Homebound since May 2021  PLAN.  DM, dyslipidemia, depression, MS. He seems to be stable, reports he is follow-up by the "home-based primary care" team put together vy the [place]. He had his 4 COVID shot just few days ago from the team. At this point recommend to continue following up with them, RTC as needed to this office. Personal history of colon polyps. Has a family and personal history of colon polyps.  Last colonoscopy was 2016 by Dr. Baldwin Jamaica in [facility], no polyps noted there.   Patient wonders about doing another colonoscopy. Pros and cons of a procedure such as that discussed.  In my opinion doing prep will be very difficult on him and could lead to dehydration, falls etc. Nevertheless, I offer him a referral to Dr. Baldwin Jamaica but he will think about it.  He has also discussed the issue with the [place]. RTC as needed.  Time spent 22 minutes, reviewing the chart, some information found on care everywhere, also discussing his history of colon polyps    This visit occurred during the SARS-CoV-2 public health emergency.  Safety protocols were in place, including screening questions prior to the visit, additional usage of staff PPE, and extensive cleaning of exam room while observing appropriate contact time as indicated for disinfecting solutions.

## 2021-04-12 NOTE — Assessment & Plan Note (Signed)
DM, dyslipidemia, depression, MS. He seems to be stable, reports he is follow-up by the "home-based primary care" team put together vy the [place]. He had his 4 COVID shot just few days ago from the team. At this point recommend to continue following up with them, RTC as needed to this office. Personal history of colon polyps. Has a family and personal history of colon polyps.  Last colonoscopy was 2016 by Dr. Baldwin Jamaica in [facility], no polyps noted there.  Patient wonders about doing another colonoscopy. Pros and cons of a procedure such as that discussed.  In my opinion doing prep will be very difficult on him and could lead to dehydration, falls etc. Nevertheless, I offer him a referral to Dr. Baldwin Jamaica but he will think about it.  He has also discussed the issue with the [place]. RTC as needed.

## 2021-12-07 ENCOUNTER — Telehealth: Payer: Self-pay | Admitting: Internal Medicine

## 2021-12-07 NOTE — Telephone Encounter (Signed)
Left message for patient to call back and schedule Medicare Annual Wellness Visit (AWV) in office.   If not able to come in office, please offer to do virtually or by telephone.  Left office number and my jabber #[phone].  Last AWV:02/13/2017  Please schedule at anytime with Nurse Health Advisor.   

## 2022-02-01 ENCOUNTER — Telehealth: Payer: Self-pay | Admitting: Internal Medicine

## 2022-02-01 NOTE — Telephone Encounter (Signed)
I called patient to schedule AWV. I spoke to patient's wife and she said she thought Dr.Paz had released patient from the practice.

## 2022-06-07 LAB — CBC AND DIFFERENTIAL
HCT: 39 — AB (ref 41–53)
Hemoglobin: 13.2 — AB (ref 13.5–17.5)
Platelets: 192 10*3/uL (ref 150–400)
WBC: 5

## 2022-06-07 LAB — BASIC METABOLIC PANEL
BUN: 14 (ref 4–21)
CO2: 25 — AB (ref 13–22)
Chloride: 108 (ref 99–108)
Creatinine: 0.9 (ref 0.6–1.3)
Glucose: 103
Potassium: 3.5 mEq/L (ref 3.5–5.1)
Sodium: 137 (ref 137–147)

## 2022-06-07 LAB — HEMOGLOBIN A1C: Hemoglobin A1C: 5.3

## 2022-06-07 LAB — HEPATIC FUNCTION PANEL
ALT: 20 U/L (ref 10–40)
AST: 15 (ref 14–40)
Alkaline Phosphatase: 57 (ref 25–125)
Bilirubin, Direct: 0.2 (ref 0.01–0.4)
Bilirubin, Total: 0.6

## 2022-06-07 LAB — COMPREHENSIVE METABOLIC PANEL
Albumin: 3.4 — AB (ref 3.5–5.0)
Calcium: 8.8 (ref 8.7–10.7)
eGFR: 91

## 2022-06-07 LAB — TSH: TSH: 1.15 (ref 0.41–5.90)

## 2022-06-07 LAB — CBC: RBC: 4.9 (ref 3.87–5.11)

## 2022-06-07 LAB — HM DIABETES FOOT EXAM

## 2022-06-07 LAB — LIPID PANEL
Cholesterol: 108 (ref 0–200)
HDL: 53 (ref 35–70)
LDL Cholesterol: 30
Triglycerides: 126 (ref 40–160)

## 2022-06-07 LAB — VITAMIN D 25 HYDROXY (VIT D DEFICIENCY, FRACTURES): Vit D, 25-Hydroxy: 60.96

## 2022-06-12 ENCOUNTER — Encounter: Payer: Self-pay | Admitting: Internal Medicine

## 2022-06-28 ENCOUNTER — Encounter: Payer: Self-pay | Admitting: Internal Medicine

## 2024-08-22 ENCOUNTER — Emergency Department (HOSPITAL_BASED_OUTPATIENT_CLINIC_OR_DEPARTMENT_OTHER)
Admission: EM | Admit: 2024-08-22 | Discharge: 2024-08-22 | Disposition: A | Discharge status: Home / Self Care | Attending: Emergency Medicine | Admitting: Emergency Medicine

## 2024-08-22 ENCOUNTER — Encounter (HOSPITAL_BASED_OUTPATIENT_CLINIC_OR_DEPARTMENT_OTHER): Payer: Self-pay | Admitting: Emergency Medicine

## 2024-08-22 ENCOUNTER — Other Ambulatory Visit: Payer: Self-pay

## 2024-08-22 ENCOUNTER — Emergency Department (HOSPITAL_BASED_OUTPATIENT_CLINIC_OR_DEPARTMENT_OTHER)

## 2024-08-22 DIAGNOSIS — X58XXXA Exposure to other specified factors, initial encounter: Secondary | ICD-10-CM | POA: Insufficient documentation

## 2024-08-22 DIAGNOSIS — S91104A Unspecified open wound of right lesser toe(s) without damage to nail, initial encounter: Secondary | ICD-10-CM | POA: Diagnosis present

## 2024-08-22 DIAGNOSIS — E119 Type 2 diabetes mellitus without complications: Secondary | ICD-10-CM | POA: Diagnosis not present

## 2024-08-22 DIAGNOSIS — Z7982 Long term (current) use of aspirin: Secondary | ICD-10-CM | POA: Insufficient documentation

## 2024-08-22 DIAGNOSIS — S91109A Unspecified open wound of unspecified toe(s) without damage to nail, initial encounter: Secondary | ICD-10-CM

## 2024-08-22 DIAGNOSIS — Z87891 Personal history of nicotine dependence: Secondary | ICD-10-CM | POA: Diagnosis not present

## 2024-08-22 LAB — CBC
HCT: 41.6 % (ref 39.0–52.0)
Hemoglobin: 13.5 g/dL (ref 13.0–17.0)
MCH: 26.7 pg (ref 26.0–34.0)
MCHC: 32.5 g/dL (ref 30.0–36.0)
MCV: 82.2 fL (ref 80.0–100.0)
Platelets: 254 K/uL (ref 150–400)
RBC: 5.06 MIL/uL (ref 4.22–5.81)
RDW: 14.2 % (ref 11.5–15.5)
WBC: 5.8 K/uL (ref 4.0–10.5)
nRBC: 0 % (ref 0.0–0.2)

## 2024-08-22 LAB — BASIC METABOLIC PANEL WITH GFR
Anion gap: 12 (ref 5–15)
BUN: 16 mg/dL (ref 8–23)
CO2: 23 mmol/L (ref 22–32)
Calcium: 9.4 mg/dL (ref 8.9–10.3)
Chloride: 106 mmol/L (ref 98–111)
Creatinine, Ser: 0.84 mg/dL (ref 0.61–1.24)
GFR, Estimated: >60 mL/min (ref >60)
Glucose, Bld: 112 mg/dL — ABNORMAL HIGH (ref 70–99)
Potassium: 4.3 mmol/L (ref 3.5–5.1)
Sodium: 140 mmol/L (ref 135–145)

## 2024-08-22 NOTE — ED Notes (Signed)
 XR left bedside at this time

## 2024-08-22 NOTE — ED Triage Notes (Signed)
 Pt reports wound infection to RT toes (first 3); was seen at [facility] yesterday and informed today that wound + for MRSA

## 2024-08-22 NOTE — Discharge Instructions (Signed)
 As discussed, continue antibiotics at home as well as washing area gently with warm soapy water.  Recommend follow-up with orthopedics next week for reevaluation.  I have sent a referral to the orthopedic specialist as well as given you their number to call.  Please do not hesitate to return if the worrisome signs and symptoms discussed to become apparent.

## 2024-08-22 NOTE — ED Notes (Signed)
 Lean cuisine chicken/ mashed potatoes Orange juice

## 2024-08-22 NOTE — ED Notes (Signed)
 During XR imaging the pts wounds on his toes were opened and starting to bleed.  A chuckpad was placed and bleeding appears to be less at this time.

## 2024-08-22 NOTE — ED Provider Notes (Signed)
 [facility] Provider Note   CSN: [id number] Arrival date & time: 08/22/24  1414     Patient presents with: Wound Infection   Oscar Matthews. is a 73 y.o. male.   HPI   73 year old male presents to the emergency department with complaints of wound 2nd and 3rd toes of right foot.  States that areas been present there for about the past month.  Was seen urgent care on the 20th and was placed on cefdinir.  Concern for wound infection.  Had wound cultures that were positive for MRSA.  Antibiotic added was doxycycline  of which patient has taken 2 doses of.  Patient denies any pain in the foot.  After the wound culture resulted, did not know whether or not to take both antibiotics or just anyone.  Also concerned about diagnosis of MRSA.  Denies any fevers, chills, known trauma/injury.  Denies history of similar kind of wounds in the past.  Denies history of diabetes.  Past medical history stated for multiple sclerosis, diabetes mellitus, dyslipidemia.,  Osteopenia  Prior to Admission medications   Medication Sig Start Date End Date Taking? Authorizing Provider  acetaminophen  (TYLENOL ) 500 MG tablet Take 500 mg by mouth every 6 (six) hours as needed.    [provider]  aspirin  81 MG tablet Take 81 mg by mouth daily. Reported on 01/24/2016    [provider]  atorvastatin  (LIPITOR) 20 MG tablet Take 20 mg by mouth daily.    [provider]  carboxymethylcellulose (REFRESH PLUS) 0.5 % SOLN Place 1 drop into both eyes daily as needed.    [provider]  Ferd Paint 1.5 % LIQD Apply 1 application topically 2 (two) times daily. Patient not taking: Reported on 04/11/2021    [provider]  Cholecalciferol  (VITAMIN D -3) 1000 units CAPS Take 4,000 Units by mouth daily.    [provider]  fluticasone  (FLONASE ) 50 MCG/ACT nasal spray Place 1-2 sprays into both nostrils daily.    [provider]  gabapentin  (NEURONTIN ) 800 MG tablet Take 800 mg by mouth 3 (three) times daily. Reported on 01/24/2016    [provider]  MECLIZINE HCL PO Take by mouth.    [provider]  Multiple Vitamin (MULTIVITAMIN WITH MINERALS) TABS tablet Take 1 tablet by mouth daily.    [provider]  MYRBETRIQ 50 MG TB24 tablet Take 50 mg by mouth daily. 09/26/16   [provider]  Teriflunomide  14 MG TABS Take 14 mg by mouth daily.    [provider]  vitamin C (ASCORBIC ACID) 500 MG tablet Take 500 mg by mouth daily.    [provider]    Allergies: Patient has no known allergies.    Review of Systems  All other systems reviewed and are negative.   Updated Vital Signs BP 104/66 (BP Location: Left Arm)   Pulse 92   Temp 97.9 F (36.6 C) (Oral)   Resp 20   Ht 5' 9 (1.753 m)   Wt 74.8 kg   SpO2 97%   BMI 24.37 kg/m   Physical Exam Vitals and nursing note reviewed.  Constitutional:      General: He is not in acute distress.    Appearance: He is well-developed.  HENT:     Head: Normocephalic and atraumatic.  Eyes:     Conjunctiva/sclera: Conjunctivae normal.  Cardiovascular:     Rate and Rhythm: Normal rate and regular rhythm.  Pulmonary:  Effort: Pulmonary effort is normal. No respiratory distress.     Breath sounds: Normal breath sounds.  Abdominal:     Palpations: Abdomen is soft.     Tenderness: There is no abdominal tenderness.  Musculoskeletal:        General: No swelling.     Cervical back: Neck supple.     Comments: 2+ pedal and posterior tibial pulses.  Able to range right ankle digits fully.  Patient with rather shallow wound appreciated on the medial aspect secondary to right foot.  Area vascular palpable bleeding after bandage removal.  Additional.  Shallow wound on the medial aspect of third digit right foot.  Area is not ulcerated in nature.  Inability to probe to bone. Swollen second digit of right foot with darker  appearing skin dorsally compared to rest of foot. See media below.   Skin:    General: Skin is warm and dry.     Capillary Refill: Capillary refill takes less than 2 seconds.  Neurological:     Mental Status: He is alert.  Psychiatric:        Mood and Affect: Mood normal.        (all labs ordered are listed, but only abnormal results are displayed) Labs Reviewed  CBC  BASIC METABOLIC PANEL WITH GFR    EKG: None  Radiology: No results found.   Procedures   Medications Ordered in the ED - No data to display                                  Medical Decision Making Amount and/or Complexity of Data Reviewed Labs: ordered. Radiology: ordered.   This patient presents to the ED for concern of foot wound, this involves an extensive number of treatment options, and is a complaint that carries with it a high risk of complications and morbidity.  The differential diagnosis includes fracture, strain/sprain, osteomyelitis, cellulitis, ischemic toe, sepsis, other   Co morbidities that complicate the patient evaluation  See HPI   Additional history obtained:  Additional history obtained from EMR External records from outside source obtained and reviewed including hospital records   Lab Tests:  I Ordered, and personally interpreted labs.  The pertinent results include: No leukocytosis.  No evidence of anemia.  Platelets within normal range.  No Electra abnormalities.  No renal dysfunction.   Imaging Studies ordered:  I ordered imaging studies including right foot x-ray I independently visualized and interpreted imaging which showed osteopenia.  No obvious acute osseous abnormality. I agree with the radiologist interpretation  Cardiac Monitoring: / EKG:  N/a   Consultations Obtained:  I requested consultation with attending Dr. Emil who recommended follow up with ortho outpatient after evaluating digit given the patient is not systemically ill.  Would recommend  continuing antibiotics.   Problem List / ED Course / Critical interventions / Medication management  Wound on 2nd and 3rd toes of right foot Reevaluation of the patient showed that the patient stayed the same I have reviewed the patients home medicines and have made adjustments as needed   Social Determinants of Health:  Former cigarette use.  Denies illicit drug use.   Test / Admission - Considered:  Wound on 2nd and 3rd toes of right foot Vitals signs within normal range and stable throughout visit. Laboratory/imaging studies significant for: See above  73 year old male presents to the emergency department with complaints of wound 2nd and 3rd  toes of right foot.  States that areas been present there for about the past month.  Was seen urgent care on the 20th and was placed on cefdinir.  Concern for wound infection.  Had wound cultures that were positive for MRSA.  Antibiotic added was doxycycline  of which patient has taken 2 doses of.  Patient denies any pain in the foot.  After the wound culture resulted, did not know whether or not to take both antibiotics or just anyone.  Also concerned about diagnosis of MRSA.  Denies any fevers, chills, known trauma/injury.  Denies history of similar kind of wounds in the past.  Denies history of diabetes. On exam, patient with very shallow wound appreciated medial aspect of 2nd and 3rd digits as above.  Swollen second digit.  No obvious bony tenderness.  Intact pulses to the foot.  Labs unremarkable for any acute process.  X-ray imaging negative.  No clinical evidence of osteomyelitis.  Given exam findings above with darker appearing skin especially dorsal aspect of the second digit although with good pulses to the foot, consulted attending regarding the case who recommended continuing antibiotics prescribed and following up outpatient with orthopedic next week.  Will recommend local wound care at home.  Treatment plan discussed with patient patient  understanding was agreeable to said plan.  Patient well-appearing, afebrile in no acute distress upon discharge. Worrisome signs and symptoms were discussed with the patient, and the patient acknowledged understanding to return to the ED if noticed. Patient was stable upon discharge.       Final diagnoses:  None    ED Discharge Orders     None          Silver Wonda LABOR, [place] 08/22/24 1703    Emil Share, DO 08/22/24 1718

## 2024-09-01 ENCOUNTER — Other Ambulatory Visit (HOSPITAL_COMMUNITY): Payer: Self-pay | Admitting: Orthopaedic Surgery

## 2024-09-01 DIAGNOSIS — S91109A Unspecified open wound of unspecified toe(s) without damage to nail, initial encounter: Secondary | ICD-10-CM

## 2024-09-03 ENCOUNTER — Ambulatory Visit (HOSPITAL_COMMUNITY)
Admission: RE | Admit: 2024-09-03 | Discharge: 2024-09-03 | Disposition: A | Discharge status: Home / Self Care | Source: Ambulatory Visit | Attending: Orthopaedic Surgery | Admitting: Orthopaedic Surgery

## 2024-09-03 DIAGNOSIS — S91109A Unspecified open wound of unspecified toe(s) without damage to nail, initial encounter: Secondary | ICD-10-CM | POA: Diagnosis present

## 2024-09-04 ENCOUNTER — Ambulatory Visit (HOSPITAL_COMMUNITY)
Admission: RE | Admit: 2024-09-04 | Discharge: 2024-09-04 | Disposition: A | Discharge status: Home / Self Care | Source: Ambulatory Visit | Attending: Vascular Surgery | Admitting: Vascular Surgery

## 2024-09-04 ENCOUNTER — Other Ambulatory Visit (HOSPITAL_COMMUNITY): Payer: Self-pay | Admitting: Orthopaedic Surgery

## 2024-09-04 DIAGNOSIS — M7989 Other specified soft tissue disorders: Secondary | ICD-10-CM

## 2024-09-04 DIAGNOSIS — M79604 Pain in right leg: Secondary | ICD-10-CM | POA: Diagnosis not present

## 2024-09-05 LAB — VAS US ABI WITH/WO TBI
Left ABI: 1.27
Right ABI: 0.99
